# Patient Record
Sex: Male | Born: 1965 | Race: White | Hispanic: No | Marital: Married | Smoking: Never smoker
Health system: Southern US, Community
[De-identification: ages and names within clinical notes are randomized; demographics above are authoritative.]

## PROBLEM LIST (undated history)

## (undated) DIAGNOSIS — E669 Obesity, unspecified: Secondary | ICD-10-CM

## (undated) DIAGNOSIS — I10 Essential (primary) hypertension: Secondary | ICD-10-CM

## (undated) DIAGNOSIS — U071 COVID-19: Secondary | ICD-10-CM

## (undated) DIAGNOSIS — G4730 Sleep apnea, unspecified: Secondary | ICD-10-CM

## (undated) DIAGNOSIS — E119 Type 2 diabetes mellitus without complications: Secondary | ICD-10-CM

## (undated) DIAGNOSIS — E785 Hyperlipidemia, unspecified: Secondary | ICD-10-CM

## (undated) HISTORY — DX: Hyperlipidemia, unspecified: E78.5

## (undated) HISTORY — DX: Essential (primary) hypertension: I10

## (undated) HISTORY — PX: WISDOM TOOTH EXTRACTION: SHX21

## (undated) HISTORY — DX: Obesity, unspecified: E66.9

## (undated) HISTORY — PX: TONSILLECTOMY: SUR1361

## (undated) HISTORY — DX: Type 2 diabetes mellitus without complications: E11.9

## (undated) HISTORY — DX: Sleep apnea, unspecified: G47.30

## (undated) HISTORY — DX: COVID-19: U07.1

---

## 2016-01-24 ENCOUNTER — Ambulatory Visit
Admission: EM | Admit: 2016-01-24 | Discharge: 2016-01-24 | Disposition: A | Discharge status: Home / Self Care | Payer: Managed Care, Other (non HMO) | Attending: Family Medicine | Admitting: Family Medicine

## 2016-01-24 ENCOUNTER — Encounter: Payer: Self-pay | Admitting: *Deleted

## 2016-01-24 DIAGNOSIS — Z20828 Contact with and (suspected) exposure to other viral communicable diseases: Secondary | ICD-10-CM

## 2016-01-24 MED ORDER — OSELTAMIVIR PHOSPHATE 75 MG PO CAPS: ORAL_CAPSULE | ORAL | Status: DC

## 2016-01-24 NOTE — ED Notes (Signed)
Patient's wife was diagnosed with flu at [facility] yesterday. Patient is seeking preventative medication for the flu.

## 2016-01-24 NOTE — Discharge Instructions (Signed)
Droplet Precautions Droplet precautions are guidelines for the care of a person who has a disease that can spread through mucus or secretions from the mouth, lungs, throat, or airways. Following these guidelines helps to prevent the disease from spreading to others. GUIDELINES FOR PATIENTS If you have a disease that can spread through mucus or secretions, follow these guidelines during your stay:  Check with your nurse before you leave the room in which you are being treated.  Wear a mask if you go to another area of the hospital or clinic. Make sure that the mask fits tightly.  Cover your mouth with a tissue when you cough.  Cover your nose with a tissue when you sneeze.  Try to stay 3 feet or more away from another person while talking.  Wash your hands often. GUIDELINES FOR VISITORS If you are visiting a person who has a disease that can spread through mucus or secretions, follow these guidelines: 1. Check with a nurse before you enter a room that has a sign that says "Droplet Precautions." 2. If you are allowed to enter the room, you will be asked to wash your hands and wear a mask over your nose and mouth. Make sure that the mask fits tightly. You may also be told to wear eye protection. 3. Do not take off your mask in the room. If you were told to wear eye protection, do not take it off in the room. 4. Do not eat or drink in the room unless you ask a nurse first. 5. Do not touch anything in the room unless you ask a nurse first. 6. Right after you leave the room, take off your mask and throw it in the trash. If you were told to wear eye protection, take it off and throw it in the trash. Then wash your hands.   This information is not intended to replace advice given to you by your health care provider. Make sure you discuss any questions you have with your health care provider.   Document Released: 02/20/2015 Document Reviewed: 02/20/2015 Elsevier Interactive Patient Education 2016  [employer]  Contact Precautions Contact precautions are guidelines for the care of a person who has a disease that can spread by skin-to-skin contact or by contact with bodily fluids, such as saliva, stool, and fluid from open wounds. Following these guidelines helps to prevent the disease from spreading to others. GUIDELINES FOR PATIENTS If you have a disease that can spread by skin-to-skin contact or by contact with bodily fluids, follow these guidelines during your stay:  Check with your nurse before you leave the room in which you are being treated.  Wear a gown and a bandage (dressing) over the infected area if you need to leave the room in which you are being treated.  Wash your hands often. GUIDELINES FOR VISITORS If you are visiting a person who has a disease that can spread by skin-to-skin contact or by contact with bodily fluids, follow these guidelines: 7. Check with a nurse before you enter a room that has a sign that says "Contact Precautions." 8. If you are allowed to enter the room, you will be asked to wash your hands and wear a gown and gloves. 9. Do not take off your gown or gloves in the room until you are leaving. 10. Do not eat or drink in the room unless you ask a nurse first. 11. Do not touch anything in the room unless you ask a nurse first. 12.  Right before you leave the room, take off your gown and then your gloves. Leave them in the room as directed. Then wash your hands.   This information is not intended to replace advice given to you by your health care provider. Make sure you discuss any questions you have with your health care provider.   Document Released: 02/20/2015 Document Reviewed: 02/20/2015 Elsevier Interactive Patient Education 2016 [employer].  Infection Control in the Home If you have an infection or you are taking care of someone who has an infection, it is important to know how to keep the infection from spreading. Follow these guidelines  to help stop the spread of infection, and talk to your health care provider. HOW ARE INFECTIONS SPREAD? In order for an infection to spread, the following must be present:  A germ. This may be a virus, bacteria, fungus, or parasite.  A place for the germ to live. This may be:  On or in a person, animal, plant, or food.  In soil or water.  On surfaces, such as a door handle.  A susceptible host. This is a person or animal who does not have resistance (immunity) to the germ.  A way for the germ to enter the host. This may occur by:  Direct contact. This may happen by making contact--such as shaking hands or hugging--with an infected person or animal. Some germs can also travel through the air and spread to you if an infected person coughs or sneezes on you or near you.  Indirect contact. This is when the germ enters the host through contact with an infected object. Examples include eating contaminated food, drinking contaminated water, or touching a contaminated surface with your hands and then touching your face, nose, or mouth soon after that. HOW CAN I HELP TO PREVENT INFECTION FROM SPREADING? There are several things that you can do to help prevent infection from spreading. Hand Washing It is very important to wash your hands correctly, following these steps: 13. Wet your hands with clean, running water. 14. Apply soap to your hands. Liquid soap is better than bar soap. 15. Rub your hands together quickly to create lather. 16. Keep rubbing your hands together for at least 20 seconds. Thoroughly scrub all parts of your hands, including under your fingernails and between your fingers. 17. Rinse your hands with clean, running water until all of the soap is gone. 18. Dry your hands with an air dryer or a clean paper or cloth towel, or let your hands air-dry. Do not use your clothing or a soiled towel to dry your hands. 19. If you are in a public restroom, use your towel to turn off the  water faucet and to open the bathroom door. Make sure to wash your hands:  Before:  Visiting a baby or anyone with a weakened or lowered defense (immune) system.  Putting in and taking out any contact lenses.  After:  Working or playing outside.  Touching an animal or its toys or leash.  Handling livestock.  Using the bathroom or helping a child or adult to use the bathroom.  Using household cleaners or toxic chemicals.  Touching or taking out the garbage.  Touching anything dirty around your home.  Handling soiled clothes or rags.  Taking care of a sick child. This includes touching used tissues, toys, and clothes.  Sneezing, coughing, or blowing your nose.  Using public transportation.  Shaking hands.  Using a phone, including your mobile phone.  Touching  money.  Before and after:  Preparing food.  Preparing a bottle for a baby.  Feeding a baby or a young child.  Eating.  Visiting or taking care of someone who is sick.  Changing a diaper.  Changing a bandage (dressing) or taking care of an injury or wound.  Giving or taking medicine. Taking Care of Your Home  Make sure that you have enough cleaning supplies at all times. These include:  Disinfectants.  Reusable cleaning cloths. Wash these after each use.  Paper towels.  Utility gloves. Replace your gloves if they are cracked or torn or if they start to peel.  Use bleach safely. Never mix it with other cleaning products, especially those that contain ammonia. This mixture can create a dangerous gas that may be deadly.  Take care of your cleaning supplies. Toilet brushes, mops, and sponges can breed germs. Soak them in bleach and water for 5 minutes after each use.  Do not pour used mop water down the sink. Pour it down the toilet instead.  Maintain proper ventilation in your home.  If you have a pet, ensure that your pet stays clean. Do not let people with weak immune systems touch bird  droppings, fish tank water, or a litter box.  If you have a cat, be sure to change the litter every day.  In the bathroom, make sure you:  Provide liquid soap.  Change towels and washcloths frequently. Avoid sharing towels and washcloths.  Change toothbrushes often and store them separately in a clean, dry place.  Disinfect the toilet.  Clean the tub, shower, and sink with standard cleaning products.   Mop the floor with a standard cleaner.  Do not share personal items, such as razors, toothbrushes, drinking glasses, deodorant, combs, brushes, towels, and washcloths.   In the kitchen, make sure you:  Store food carefully.  Refrigerate leftovers promptly in covered containers.  Throw out stale or spoiled food.  Clean the inside of your refrigerator each week.  Keep your refrigerator set at 69F (4C) or less, and set your freezer at 8F (-18C) or less.  Thaw foods in the refrigerator or microwave, not at room temperature.  Serve foods at the proper temperature. Do not eat raw meat. Make sure it is cooked to the appropriate temperature.Cook eggs until they are firm.  Wash fruits and vegetables under running water.  Use separate cutting boards, plates, and utensils for raw foods and cooked foods.  Keep work surfaces clean.  Use a clean spoon each time you sample food while cooking.  Wash your dishes in hot, soapy water. Air-dry your dishes or use a dishwasher.  Do not share forks, cups, or spoons during meals.  Wear gloves if laundry is visibly soiled.  Change linens each week or whenever they are soiled.  Do not shake soiled linens. Doing that may send germs into the air. Put dressings, sanitary or incontinence pads, diapers, and gloves in plastic garbage bags for disposal.   This information is not intended to replace advice given to you by your health care provider. Make sure you discuss any questions you have with your health care provider.   Document  Released: 07/15/2008 Document Revised: 10/27/2014 Document Reviewed: 06/08/2014 Elsevier Interactive Patient Education [employer]

## 2016-01-24 NOTE — ED Provider Notes (Signed)
CSN: [id number]     Arrival date & time 01/24/16  0804 History   First MD Initiated Contact with Patient 01/24/16 [phone]     Chief Complaint  Patient presents with  . Influenza   (Consider location/radiation/quality/duration/timing/severity/associated sxs/prior Treatment) HPI   This is a 50 year old male presents requesting a prophylactic Prescription of Tamiflu since his wife was diagnosed yesterday in our clinic with influenza A. He had his flu shot in October or November and currently is denying any symptoms. However he would like to prevent having the flu as much as possible. He denies any fever or chills and has not had any coughing.      History reviewed. No pertinent past medical history. History reviewed. No pertinent past surgical history. Family History  Problem Relation Age of Onset  . Cancer Mother   . Diabetes Mother   . CAD Father   . Emphysema Father    Social History  Substance Use Topics  . Smoking status: Never Smoker   . Smokeless tobacco: Never Used  . Alcohol Use: No    Review of Systems  Constitutional: Negative for fever, chills, diaphoresis, activity change, appetite change and fatigue.  Respiratory: Negative for cough and shortness of breath.   All other systems reviewed and are negative.   Allergies  Review of patient's allergies indicates no known allergies.  Home Medications   Prior to Admission medications   Medication Sig Start Date End Date Taking? Authorizing Provider  oseltamivir (TAMIFLU) 75 MG capsule Take one 75 mg daily for 10 days 01/24/16   Lutricia Feil, PA-C   Meds Ordered and Administered this Visit  Medications - No data to display  BP 153/94 mmHg  Pulse 97  Temp(Src) 98.2 F (36.8 C) (Oral)  Resp 18  Ht  (1.778 m)  Wt 300 lb (136.079 kg)  BMI 43.05 kg/m2  SpO2 99% No data found.   Physical Exam  Constitutional: He is oriented to person, place, and time. He appears well-developed and well-nourished. No  distress.  HENT:  Head: Normocephalic and atraumatic.  Right Ear: External ear normal.  Left Ear: External ear normal.  Nose: Nose normal.  Mouth/Throat: Oropharynx is clear and moist.  Eyes: Conjunctivae are normal. Pupils are equal, round, and reactive to light.  Neck: Normal range of motion. Neck supple.  Pulmonary/Chest: Effort normal and breath sounds normal. No respiratory distress. He has no wheezes. He has no rales.  Musculoskeletal: Normal range of motion. He exhibits no edema or tenderness.  Lymphadenopathy:    He has no cervical adenopathy.  Neurological: He is alert and oriented to person, place, and time.  Skin: Skin is warm and dry. He is not diaphoretic.  Psychiatric: He has a normal mood and affect. His behavior is normal. Judgment and thought content normal.  Nursing note and vitals reviewed.   ED Course  Procedures (including critical care time)  Labs Review Labs Reviewed - No data to display  Imaging Review No results found.   Visual Acuity Review  Right Eye Distance:   Left Eye Distance:   Bilateral Distance:    Right Eye Near:   Left Eye Near:    Bilateral Near:         MDM   1. Exposure to influenza    Discharge Medication List as of 01/24/2016  8:53 AM    START taking these medications   Details  oseltamivir (TAMIFLU) 75 MG capsule Take one 75 mg daily for 10 days, Normal  Plan: 1. Test/x-ray results and diagnosis reviewed with patient 2. rx as per orders; risks, benefits, potential side effects reviewed with patient 3. Recommend supportive treatment with Fluids and rest. If he does develop symptoms of the flu he may double up on the Tamiflu taking it twice a day as it once a day. 4. F/u prn if symptoms worsen or don't improve     Lutricia FeilWilliam P Winton Offord, PA-C 01/24/16 0900

## 2018-04-21 ENCOUNTER — Ambulatory Visit (INDEPENDENT_AMBULATORY_CARE_PROVIDER_SITE_OTHER): Payer: Managed Care, Other (non HMO) | Admitting: Internal Medicine

## 2018-04-21 ENCOUNTER — Encounter: Payer: Self-pay | Admitting: Internal Medicine

## 2018-04-21 ENCOUNTER — Other Ambulatory Visit: Payer: Self-pay

## 2018-04-21 VITALS — BP 156/80 | HR 109 | Temp 98.2°F | Ht 70.0 in | Wt 334.4 lb

## 2018-04-21 DIAGNOSIS — I10 Essential (primary) hypertension: Secondary | ICD-10-CM

## 2018-04-21 DIAGNOSIS — K429 Umbilical hernia without obstruction or gangrene: Secondary | ICD-10-CM | POA: Diagnosis not present

## 2018-04-21 DIAGNOSIS — E559 Vitamin D deficiency, unspecified: Secondary | ICD-10-CM

## 2018-04-21 DIAGNOSIS — Z1159 Encounter for screening for other viral diseases: Secondary | ICD-10-CM

## 2018-04-21 DIAGNOSIS — Z6841 Body Mass Index (BMI) 40.0 and over, adult: Secondary | ICD-10-CM

## 2018-04-21 DIAGNOSIS — Z0184 Encounter for antibody response examination: Secondary | ICD-10-CM

## 2018-04-21 DIAGNOSIS — Z1329 Encounter for screening for other suspected endocrine disorder: Secondary | ICD-10-CM

## 2018-04-21 DIAGNOSIS — Z125 Encounter for screening for malignant neoplasm of prostate: Secondary | ICD-10-CM | POA: Diagnosis not present

## 2018-04-21 DIAGNOSIS — I872 Venous insufficiency (chronic) (peripheral): Secondary | ICD-10-CM | POA: Diagnosis not present

## 2018-04-21 DIAGNOSIS — Z1211 Encounter for screening for malignant neoplasm of colon: Secondary | ICD-10-CM | POA: Diagnosis not present

## 2018-04-21 DIAGNOSIS — Z23 Encounter for immunization: Secondary | ICD-10-CM | POA: Diagnosis not present

## 2018-04-21 DIAGNOSIS — E66813 Obesity, class 3: Secondary | ICD-10-CM

## 2018-04-21 DIAGNOSIS — Z1389 Encounter for screening for other disorder: Secondary | ICD-10-CM

## 2018-04-21 MED ORDER — HYDROCHLOROTHIAZIDE 12.5 MG PO TABS: 12.5000 mg | ORAL_TABLET | Freq: Every day | ORAL | 0 refills | Status: DC

## 2018-04-21 MED ORDER — TRIAMCINOLONE ACETONIDE 0.1 % EX CREA: 1.0000 "application " | TOPICAL_CREAM | Freq: Two times a day (BID) | CUTANEOUS | 0 refills | Status: DC

## 2018-04-21 NOTE — Patient Instructions (Addendum)
Fasting labs Friday or next week  Fu in 2-3 weeks  Referred colonoscopy with Dr. Servando Snare in [facility]     Colonoscopy, Adult A colonoscopy is an exam to look at the entire large intestine. During the exam, a lubricated, bendable tube is inserted into the anus and then passed into the rectum, colon, and other parts of the large intestine. A colonoscopy is often done as a part of normal colorectal screening or in response to certain symptoms, such as anemia, persistent diarrhea, abdominal pain, and blood in the stool. The exam can help screen for and diagnose medical problems, including:  Tumors.  Polyps.  Inflammation.  Areas of bleeding.  Tell a health care provider about:  Any allergies you have.  All medicines you are taking, including vitamins, herbs, eye drops, creams, and over-the-counter medicines.  Any problems you or family members have had with anesthetic medicines.  Any blood disorders you have.  Any surgeries you have had.  Any medical conditions you have.  Any problems you have had passing stool. What are the risks? Generally, this is a safe procedure. However, problems may occur, including:  Bleeding.  A tear in the intestine.  A reaction to medicines given during the exam.  Infection (rare).  What happens before the procedure? Eating and drinking restrictions Follow instructions from your health care provider about eating and drinking, which may include:  A few days before the procedure - follow a low-fiber diet. Avoid nuts, seeds, dried fruit, raw fruits, and vegetables.  1-3 days before the procedure - follow a clear liquid diet. Drink only clear liquids, such as clear broth or bouillon, black coffee or tea, clear juice, clear soft drinks or sports drinks, gelatin dessert, and popsicles. Avoid any liquids that contain red or purple dye.  On the day of the procedure - do not eat or drink anything during the 2 hours before the procedure, or within the time  period that your health care provider recommends.  Bowel prep If you were prescribed an oral bowel prep to clean out your colon:  Take it as told by your health care provider. Starting the day before your procedure, you will need to drink a large amount of medicated liquid. The liquid will cause you to have multiple loose stools until your stool is almost clear or light green.  If your skin or anus gets irritated from diarrhea, you may use these to relieve the irritation: ? Medicated wipes, such as adult wet wipes with aloe and vitamin E. ? A skin soothing-product like petroleum jelly.  If you vomit while drinking the bowel prep, take a break for up to 60 minutes and then begin the bowel prep again. If vomiting continues and you cannot take the bowel prep without vomiting, call your health care provider.  General instructions  Ask your health care provider about changing or stopping your regular medicines. This is especially important if you are taking diabetes medicines or blood thinners.  Plan to have someone take you home from the hospital or clinic. What happens during the procedure?  An IV tube may be inserted into one of your veins.  You will be given medicine to help you relax (sedative).  To reduce your risk of infection: ? Your health care team will wash or sanitize their hands. ? Your anal area will be washed with soap.  You will be asked to lie on your side with your knees bent.  Your health care provider will lubricate a long,  thin, flexible tube. The tube will have a camera and a light on the end.  The tube will be inserted into your anus.  The tube will be gently eased through your rectum and colon.  Air will be delivered into your colon to keep it open. You may feel some pressure or cramping.  The camera will be used to take images during the procedure.  A small tissue sample may be removed from your body to be examined under a microscope (biopsy). If any  potential problems are found, the tissue will be sent to a lab for testing.  If small polyps are found, your health care provider may remove them and have them checked for cancer cells.  The tube that was inserted into your anus will be slowly removed. The procedure may vary among health care providers and hospitals. What happens after the procedure?  Your blood pressure, heart rate, breathing rate, and blood oxygen level will be monitored until the medicines you were given have worn off.  Do not drive for 24 hours after the exam.  You may have a small amount of blood in your stool.  You may pass gas and have mild abdominal cramping or bloating due to the air that was used to inflate your colon during the exam.  It is up to you to get the results of your procedure. Ask your health care provider, or the department performing the procedure, when your results will be ready. This information is not intended to replace advice given to you by your health care provider. Make sure you discuss any questions you have with your health care provider. Document Released: 10/03/2000 Document Revised: 08/06/2016 Document Reviewed: 12/18/2015 Elsevier Interactive Patient Education  2018 [employer] Inc[employer]  DASH Eating Plan DASH stands for "Dietary Approaches to Stop Hypertension." The DASH eating plan is a healthy eating plan that has been shown to reduce high blood pressure (hypertension). It may also reduce your risk for type 2 diabetes, heart disease, and stroke. The DASH eating plan may also help with weight loss. What are tips for following this plan? General guidelines  Avoid eating more than 2,300 mg (milligrams) of salt (sodium) a day. If you have hypertension, you may need to reduce your sodium intake to 1,500 mg a day.  Limit alcohol intake to no more than 1 drink a day for nonpregnant women and 2 drinks a day for men. One drink equals 12 oz of beer, 5 oz of wine, or 1 oz of hard liquor.  Work  with your health care provider to maintain a healthy body weight or to lose weight. Ask what an ideal weight is for you.  Get at least 30 minutes of exercise that causes your heart to beat faster (aerobic exercise) most days of the week. Activities may include walking, swimming, or biking.  Work with your health care provider or diet and nutrition specialist (dietitian) to adjust your eating plan to your individual calorie needs. Reading food labels  Check food labels for the amount of sodium per serving. Choose foods with less than 5 percent of the Daily Value of sodium. Generally, foods with less than 300 mg of sodium per serving fit into this eating plan.  To find whole grains, look for the word "whole" as the first word in the ingredient list. Shopping  Buy products labeled as "low-sodium" or "no salt added."  Buy fresh foods. Avoid canned foods and premade or frozen meals. Cooking  Avoid adding salt when cooking.  Use salt-free seasonings or herbs instead of table salt or sea salt. Check with your health care provider or pharmacist before using salt substitutes.  Do not fry foods. Cook foods using healthy methods such as baking, boiling, grilling, and broiling instead.  Cook with heart-healthy oils, such as olive, canola, soybean, or sunflower oil. Meal planning   Eat a balanced diet that includes: ? 5 or more servings of fruits and vegetables each day. At each meal, try to fill half of your plate with fruits and vegetables. ? Up to 6-8 servings of whole grains each day. ? Less than 6 oz of lean meat, poultry, or fish each day. A 3-oz serving of meat is about the same size as a deck of cards. One egg equals 1 oz. ? 2 servings of low-fat dairy each day. ? A serving of nuts, seeds, or beans 5 times each week. ? Heart-healthy fats. Healthy fats called Omega-3 fatty acids are found in foods such as flaxseeds and coldwater fish, like sardines, salmon, and mackerel.  Limit how much you  eat of the following: ? Canned or prepackaged foods. ? Food that is high in trans fat, such as fried foods. ? Food that is high in saturated fat, such as fatty meat. ? Sweets, desserts, sugary drinks, and other foods with added sugar. ? Full-fat dairy products.  Do not salt foods before eating.  Try to eat at least 2 vegetarian meals each week.  Eat more home-cooked food and less restaurant, buffet, and fast food.  When eating at a restaurant, ask that your food be prepared with less salt or no salt, if possible. What foods are recommended? The items listed may not be a complete list. Talk with your dietitian about what dietary choices are best for you. Grains Whole-grain or whole-wheat bread. Whole-grain or whole-wheat pasta. Brown rice. Orpah Cobb. Bulgur. Whole-grain and low-sodium cereals. Pita bread. Low-fat, low-sodium crackers. Whole-wheat flour tortillas. Vegetables Fresh or frozen vegetables (raw, steamed, roasted, or grilled). Low-sodium or reduced-sodium tomato and vegetable juice. Low-sodium or reduced-sodium tomato sauce and tomato paste. Low-sodium or reduced-sodium canned vegetables. Fruits All fresh, dried, or frozen fruit. Canned fruit in natural juice (without added sugar). Meat and other protein foods Skinless chicken or [place]. Ground chicken or [place]. Pork with fat trimmed off. Fish and seafood. Egg whites. Dried beans, peas, or lentils. Unsalted nuts, nut butters, and seeds. Unsalted canned beans. Lean cuts of beef with fat trimmed off. Low-sodium, lean deli meat. Dairy Low-fat (1%) or fat-free (skim) milk. Fat-free, low-fat, or reduced-fat cheeses. Nonfat, low-sodium ricotta or cottage cheese. Low-fat or nonfat yogurt. Low-fat, low-sodium cheese. Fats and oils Soft margarine without trans fats. Vegetable oil. Low-fat, reduced-fat, or light mayonnaise and salad dressings (reduced-sodium). Canola, safflower, olive, soybean, and sunflower oils. Avocado. Seasoning  and other foods Herbs. Spices. Seasoning mixes without salt. Unsalted popcorn and pretzels. Fat-free sweets. What foods are not recommended? The items listed may not be a complete list. Talk with your dietitian about what dietary choices are best for you. Grains Baked goods made with fat, such as croissants, muffins, or some breads. Dry pasta or rice meal packs. Vegetables Creamed or fried vegetables. Vegetables in a cheese sauce. Regular canned vegetables (not low-sodium or reduced-sodium). Regular canned tomato sauce and paste (not low-sodium or reduced-sodium). Regular tomato and vegetable juice (not low-sodium or reduced-sodium). Rosita Fire. Olives. Fruits Canned fruit in a light or heavy syrup. Fried fruit. Fruit in cream or butter sauce. Meat and other protein foods  Fatty cuts of meat. Ribs. Fried meat. Tomasa Blase. Sausage. Bologna and other processed lunch meats. Salami. Fatback. Hotdogs. Bratwurst. Salted nuts and seeds. Canned beans with added salt. Canned or smoked fish. Whole eggs or egg yolks. Chicken or [place] with skin. Dairy Whole or 2% milk, cream, and half-and-half. Whole or full-fat cream cheese. Whole-fat or sweetened yogurt. Full-fat cheese. Nondairy creamers. Whipped toppings. Processed cheese and cheese spreads. Fats and oils Butter. Stick margarine. Lard. Shortening. Ghee. Bacon fat. Tropical oils, such as coconut, palm kernel, or palm oil. Seasoning and other foods Salted popcorn and pretzels. Onion salt, garlic salt, seasoned salt, table salt, and sea salt. Worcestershire sauce. Tartar sauce. Barbecue sauce. Teriyaki sauce. Soy sauce, including reduced-sodium. Steak sauce. Canned and packaged gravies. Fish sauce. Oyster sauce. Cocktail sauce. Horseradish that you find on the shelf. Ketchup. Mustard. Meat flavorings and tenderizers. Bouillon cubes. Hot sauce and Tabasco sauce. Premade or packaged marinades. Premade or packaged taco seasonings. Relishes. Regular salad  dressings. Where to find more information:  National Heart, Lung, and Blood Institute: PopSteam.is  American Heart Association: www.heart.org Summary  The DASH eating plan is a healthy eating plan that has been shown to reduce high blood pressure (hypertension). It may also reduce your risk for type 2 diabetes, heart disease, and stroke.  With the DASH eating plan, you should limit salt (sodium) intake to 2,300 mg a day. If you have hypertension, you may need to reduce your sodium intake to 1,500 mg a day.  When on the DASH eating plan, aim to eat more fresh fruits and vegetables, whole grains, lean proteins, low-fat dairy, and heart-healthy fats.  Work with your health care provider or diet and nutrition specialist (dietitian) to adjust your eating plan to your individual calorie needs. This information is not intended to replace advice given to you by your health care provider. Make sure you discuss any questions you have with your health care provider. Document Released: 09/25/2011 Document Revised: 09/29/2016 Document Reviewed: 09/29/2016 Elsevier Interactive Patient Education  2018 [employer].  Hypertension Hypertension, commonly called high blood pressure, is when the force of blood pumping through the arteries is too strong. The arteries are the blood vessels that carry blood from the heart throughout the body. Hypertension forces the heart to work harder to pump blood and may cause arteries to become narrow or stiff. Having untreated or uncontrolled hypertension can cause heart attacks, strokes, kidney disease, and other problems. A blood pressure reading consists of a higher number over a lower number. Ideally, your blood pressure should be below 120/80. The first ("top") number is called the systolic pressure. It is a measure of the pressure in your arteries as your heart beats. The second ("bottom") number is called the diastolic pressure. It is a measure of the pressure in  your arteries as the heart relaxes. What are the causes? The cause of this condition is not known. What increases the risk? Some risk factors for high blood pressure are under your control. Others are not. Factors you can change  Smoking.  Having type 2 diabetes mellitus, high cholesterol, or both.  Not getting enough exercise or physical activity.  Being overweight.  Having too much fat, sugar, calories, or salt (sodium) in your diet.  Drinking too much alcohol. Factors that are difficult or impossible to change  Having chronic kidney disease.  Having a family history of high blood pressure.  Age. Risk increases with age.  Race. You may be at higher risk if you are  African-American.  Gender. Men are at higher risk than women before age 2. After age 39, women are at higher risk than men.  Having obstructive sleep apnea.  Stress. What are the signs or symptoms? Extremely high blood pressure (hypertensive crisis) may cause:  Headache.  Anxiety.  Shortness of breath.  Nosebleed.  Nausea and vomiting.  Severe chest pain.  Jerky movements you cannot control (seizures).  How is this diagnosed? This condition is diagnosed by measuring your blood pressure while you are seated, with your arm resting on a surface. The cuff of the blood pressure monitor will be placed directly against the skin of your upper arm at the level of your heart. It should be measured at least twice using the same arm. Certain conditions can cause a difference in blood pressure between your right and left arms. Certain factors can cause blood pressure readings to be lower or higher than normal (elevated) for a short period of time:  When your blood pressure is higher when you are in a health care provider's office than when you are at home, this is called white coat hypertension. Most people with this condition do not need medicines.  When your blood pressure is higher at home than when you are  in a health care provider's office, this is called masked hypertension. Most people with this condition may need medicines to control blood pressure.  If you have a high blood pressure reading during one visit or you have normal blood pressure with other risk factors:  You may be asked to return on a different day to have your blood pressure checked again.  You may be asked to monitor your blood pressure at home for 1 week or longer.  If you are diagnosed with hypertension, you may have other blood or imaging tests to help your health care provider understand your overall risk for other conditions. How is this treated? This condition is treated by making healthy lifestyle changes, such as eating healthy foods, exercising more, and reducing your alcohol intake. Your health care provider may prescribe medicine if lifestyle changes are not enough to get your blood pressure under control, and if:  Your systolic blood pressure is above 130.  Your diastolic blood pressure is above 80.  Your personal target blood pressure may vary depending on your medical conditions, your age, and other factors. Follow these instructions at home: Eating and drinking  Eat a diet that is high in fiber and potassium, and low in sodium, added sugar, and fat. An example eating plan is called the DASH (Dietary Approaches to Stop Hypertension) diet. To eat this way: ? Eat plenty of fresh fruits and vegetables. Try to fill half of your plate at each meal with fruits and vegetables. ? Eat whole grains, such as whole wheat pasta, brown rice, or whole grain bread. Fill about one quarter of your plate with whole grains. ? Eat or drink low-fat dairy products, such as skim milk or low-fat yogurt. ? Avoid fatty cuts of meat, processed or cured meats, and poultry with skin. Fill about one quarter of your plate with lean proteins, such as fish, chicken without skin, beans, eggs, and tofu. ? Avoid premade and processed foods. These  tend to be higher in sodium, added sugar, and fat.  Reduce your daily sodium intake. Most people with hypertension should eat less than 1,500 mg of sodium a day.  Limit alcohol intake to no more than 1 drink a day for nonpregnant women and 2  drinks a day for men. One drink equals 12 oz of beer, 5 oz of wine, or 1 oz of hard liquor. Lifestyle  Work with your health care provider to maintain a healthy body weight or to lose weight. Ask what an ideal weight is for you.  Get at least 30 minutes of exercise that causes your heart to beat faster (aerobic exercise) most days of the week. Activities may include walking, swimming, or biking.  Include exercise to strengthen your muscles (resistance exercise), such as pilates or lifting weights, as part of your weekly exercise routine. Try to do these types of exercises for 30 minutes at least 3 days a week.  Do not use any products that contain nicotine or tobacco, such as cigarettes and e-cigarettes. If you need help quitting, ask your health care provider.  Monitor your blood pressure at home as told by your health care provider.  Keep all follow-up visits as told by your health care provider. This is important. Medicines  Take over-the-counter and prescription medicines only as told by your health care provider. Follow directions carefully. Blood pressure medicines must be taken as prescribed.  Do not skip doses of blood pressure medicine. Doing this puts you at risk for problems and can make the medicine less effective.  Ask your health care provider about side effects or reactions to medicines that you should watch for. Contact a health care provider if:  You think you are having a reaction to a medicine you are taking.  You have headaches that keep coming back (recurring).  You feel dizzy.  You have swelling in your ankles.  You have trouble with your vision. Get help right away if:  You develop a severe headache or  confusion.  You have unusual weakness or numbness.  You feel faint.  You have severe pain in your chest or abdomen.  You vomit repeatedly.  You have trouble breathing. Summary  Hypertension is when the force of blood pumping through your arteries is too strong. If this condition is not controlled, it may put you at risk for serious complications.  Your personal target blood pressure may vary depending on your medical conditions, your age, and other factors. For most people, a normal blood pressure is less than 120/80.  Hypertension is treated with lifestyle changes, medicines, or a combination of both. Lifestyle changes include weight loss, eating a healthy, low-sodium diet, exercising more, and limiting alcohol. This information is not intended to replace advice given to you by your health care provider. Make sure you discuss any questions you have with your health care provider. Document Released: 10/06/2005 Document Revised: 09/03/2016 Document Reviewed: 09/03/2016 Elsevier Interactive Patient Education  2018 [employer]  Stasis Dermatitis Stasis dermatitis is a long-term (chronic) skin condition that happens when veins can no longer pump blood back to the heart (poor circulation). This condition causes a red or brown scaly rash or sores (ulcers) from the pooling of blood (stasis). This condition usually affects the lower legs. It may affect one leg or both legs. Without treatment, severe stasis dermatitis can lead to other skin conditions and infections. What are the causes? This condition is caused by poor circulation. What increases the risk? This condition is more likely to develop in people who:  Are not very active.  Stand for long periods of time.  Have veins that have become enlarged and twisted (varicose veins).  Have leg veins that are not strong enough to send blood back to the heart (venous  insufficiency).  Have had a blood clot.  Have been pregnant many  times.  Have had vein surgery.  Are obese.  Have heart or kidney failure.  Are 34 years of age or older.  What are the signs or symptoms? Common early symptoms of this condition include:  Swelling in your ankle or leg. This might get better overnight but be worse again in the day.  Skin that looks thin on your ankle and leg.  Manson Passey marks that develop slowly.  Skin that is easily irritated or cracked.  Red, swollen skin.  An achy or heavy feeling after you walk or stand for long periods of time.  Pain.  Later and more severe symptoms of this condition include:  Skin that looks shiny.  Small, open sores (ulcers). These are often red or purple.  Dry, cracking skin.  Skin that feels hard.  Severe itching.  A change in the shape or color of your lower legs.  Severe pain.  Difficulty walking.  How is this diagnosed? Your health care provider may suspect this condition from your symptoms and medical history. Your health care provider will also do a physical exam. You may need to see a health care provider who specializes in skin diseases (dermatologist). You may also have tests to confirm the diagnosis, including:  Blood tests.  Imaging studies to check blood flow (Doppler ultrasound).  Allergy tests.  How is this treated? Treatment for this condition may include medicine, such as:  Corticosteroid creams and ointments.  Non-corticosteroid medicines applied to the skin (topical).  Medicine to reduce swelling in the legs (diuretics).  Antibiotics.  Medicine to relieve itching (antihistamines).  You may also have to wear:  Compression stockings or an elastic wrap to improve circulation.  A bandage (dressing).  A wrap that contains zinc and gelatin (Unna boot).  Follow these instructions at home: Skin Care  Moisturize your skin as told by your health care provider. Do not use moisturizers with fragrance. This can irritate your skin.  Apply cool  compresses to the affected areas.  Do not scratch your skin.  Do not rub your skin dry after a bath or shower. Gently pat your skin dry.  Do not use scented soaps, detergents, or perfumes. Medicines  Take or use over-the-counter and prescription medicines only as told by your health care provider.  If you were prescribed an antibiotic medicine, take or use it as told by your health care provider. Do not stop taking or using the antibiotic even if your condition starts to improve. Lifestyle  Do not stand or sit in one position for long periods of time.  Do not cross your legs when you sit.  Raise (elevate) your legs above the level of your heart when you are sitting or lying down.  Walk as told by your health care provider. Walking increases blood flow.  Wear comfortable, loose-fitting clothing. Circulation in your legs will be worse if you wear tight pants, belts, and waistbands. General instructions  Change and remove any dressing as told by your health care provider, if this applies.  Wear compression stockings as told by your health care provider, if this applies. These stockings help to prevent blood clots and reduce swelling in your legs.  Wear the [employer] as told by your health care provider, if this applies.  Keep all follow-up visits as told by your health care provider. This is important. Contact a health care provider if:  Your condition does not improve with  treatment.  Your condition gets worse.  You have signs of infection in the affected area. Watch for: ? Swelling. ? Tenderness. ? Redness. ? Soreness. ? Warmth.  You have a fever. Get help right away if:  You notice red streaks coming from the affected area.  Your bone or joint underneath the affected area becomes painful after the skin has healed.  The affected area turns darker.  You feel a deep pain in your leg or groin.  You are short of breath. This information is not intended to replace  advice given to you by your health care provider. Make sure you discuss any questions you have with your health care provider. Document Released: 01/15/2006 Document Revised: 06/03/2016 Document Reviewed: 02/21/2015 Elsevier Interactive Patient Education  2018 [employer].  Exercising to [employer] Exercising can help you to lose weight. In order to lose weight through exercise, you need to do vigorous-intensity exercise. You can tell that you are exercising with vigorous intensity if you are breathing very hard and fast and cannot hold a conversation while exercising. Moderate-intensity exercise helps to maintain your current weight. You can tell that you are exercising at a moderate level if you have a higher heart rate and faster breathing, but you are still able to hold a conversation. How often should I exercise? Choose an activity that you enjoy and set realistic goals. Your health care provider can help you to make an activity plan that works for you. Exercise regularly as directed by your health care provider. This may include:  Doing resistance training twice each week, such as: ? Push-ups. ? Sit-ups. ? Lifting weights. ? Using resistance bands.  Doing a given intensity of exercise for a given amount of time. Choose from these options: ? 150 minutes of moderate-intensity exercise every week. ? 75 minutes of vigorous-intensity exercise every week. ? A mix of moderate-intensity and vigorous-intensity exercise every week.  Children, pregnant women, people who are out of shape, people who are overweight, and older adults may need to consult a health care provider for individual recommendations. If you have any sort of medical condition, be sure to consult your health care provider before starting a new exercise program. What are some activities that can help me to lose weight?  Walking at a rate of at least 4.5 miles an hour.  Jogging or running at a rate of 5 miles per  hour.  Biking at a rate of at least 10 miles per hour.  Lap swimming.  Roller-skating or in-line skating.  Cross-country skiing.  Vigorous competitive sports, such as football, basketball, and soccer.  Jumping rope.  Aerobic dancing. How can I be more active in my day-to-day activities?  Use the stairs instead of the elevator.  Take a walk during your lunch break.  If you drive, park your car farther away from work or school.  If you take public transportation, get off one stop early and walk the rest of the way.  Make all of your phone calls while standing up and walking around.  Get up, stretch, and walk around every 30 minutes throughout the day. What guidelines should I follow while exercising?  Do not exercise so much that you hurt yourself, feel dizzy, or get very short of breath.  Consult your health care provider prior to starting a new exercise program.  Wear comfortable clothes and shoes with good support.  Drink plenty of water while you exercise to prevent dehydration or heat stroke. Body water is  lost during exercise and must be replaced.  Work out until you breathe faster and your heart beats faster. This information is not intended to replace advice given to you by your health care provider. Make sure you discuss any questions you have with your health care provider. Document Released: 11/08/2010 Document Revised: 03/13/2016 Document Reviewed: 03/09/2014 Elsevier Interactive Patient Education  [employer].

## 2018-04-21 NOTE — Progress Notes (Signed)
Chief Complaint  Patient presents with  . New Patient (Initial Visit)   New patient  1. HTN pt has not seen physician in years and also never been on meds BP in 2017 was >150 as well he does not exercise. He does not have sx's  2. C/o left lower leg discolored wife noticed and wanted him to mention no trauma   Review of Systems  Constitutional: Negative for weight loss.  HENT: Negative for hearing loss.   Eyes: Negative for blurred vision.  Respiratory: Negative for shortness of breath.   Cardiovascular: Negative for chest pain.  Musculoskeletal: Negative for joint pain.  Skin: Positive for rash.  Neurological: Negative for headaches.  Psychiatric/Behavioral: Negative for depression.   Past Medical History:  Diagnosis Date  . Hypertension   . Obesity    Past Surgical History:  Procedure Laterality Date  . TONSILLECTOMY     age 86 or 68   . WISDOM TOOTH EXTRACTION     Family History  Problem Relation Age of Onset  . Cancer Mother        breast  . CAD Father   . Emphysema Father   . Heart disease Father        CABG   Social History   Socioeconomic History  . Marital status: Married    Spouse name: Not on file  . Number of children: Not on file  . Years of education: Not on file  . Highest education level: Not on file  Occupational History  . Not on file  Social Needs  . Financial resource strain: Not on file  . Food insecurity:    Worry: Not on file    Inability: Not on file  . Transportation needs:    Medical: Not on file    Non-medical: Not on file  Tobacco Use  . Smoking status: Never Smoker  . Smokeless tobacco: Former Network engineer and Sexual Activity  . Alcohol use: Yes  . Drug use: No  . Sexual activity: Yes  Lifestyle  . Physical activity:    Days per week: Not on file    Minutes per session: Not on file  . Stress: Not on file  Relationships  . Social connections:    Talks on phone: Not on file    Gets together: Not on file    Attends  religious service: Not on file    Active member of club or organization: Not on file    Attends meetings of clubs or organizations: Not on file    Relationship status: Not on file  . Intimate partner violence:    Fear of current or ex partner: Not on file    Emotionally abused: Not on file    Physically abused: Not on file    Forced sexual activity: Not on file  Other Topics Concern  . 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    Former chewing tobacco   Owns guns, wears seat belts, safe in relationship    No outpatient medications have been marked as taking for the 04/21/18 encounter (Office Visit) with McLean-Scocuzza, Nino Glow, MD.   No Known Allergies No results found for this or any previous visit (from the past 2160 hour(s)). Objective  Body mass index is 47.98 kg/m. Wt Readings from Last 3 Encounters:  04/21/18 Marland Kitchen)  334 lb 6.4 oz (151.7 kg)  01/24/16 300 lb (136.1 kg)   Temp Readings from Last 3 Encounters:  04/21/18 98.2 F (36.8 C) (Oral)  01/24/16 98.2 F (36.8 C) (Oral)   BP Readings from Last 3 Encounters:  04/21/18 (!) 156/80  01/24/16 (!) 153/94   Pulse Readings from Last 3 Encounters:  04/21/18 (!) 109  01/24/16 97    Physical Exam  Constitutional: He is oriented to person, place, and time. He appears well-developed and well-nourished. He is cooperative.  HENT:  Head: Normocephalic and atraumatic.  Mouth/Throat: Oropharynx is clear and moist and mucous membranes are normal.  Eyes: Pupils are equal, round, and reactive to light. Conjunctivae are normal.  Cardiovascular: Regular rhythm and normal heart sounds. Tachycardia present.  Pulmonary/Chest: Effort normal and breath sounds normal.  Abdominal: A hernia is present.    Neurological: He is alert and oriented to person, place, and time. Gait normal.  Skin: Skin is warm and dry.  Stasis dermatitis changes  LLE  Psychiatric: He has a normal mood and affect. His speech is normal and behavior is normal. Judgment and thought content normal. Cognition and memory are normal.  Nursing note and vitals reviewed.   Assessment   1. HTN  2. Stasis dermatitis LLE 3. Umbilical hernia  4. HM Plan   1. Start hctz 12.5  Check labs  F/u in 2-3 weeks  2. Trial tmc bid prn  3. Ed hernia  4.  Had flu shot  Tdap given today Disc shingrix in future  Check hep B, MMR status  Check fasting labs upcoming declines std check   Referred to [facility] Dr. Allen Norris screening colonoscopy  No need for dermatology referral now.  Used to chew chewing tobacco   [facility] saw 2018 wears glasses   Provider: Dr. Olivia Mackie McLean-Scocuzza-Internal Medicine

## 2018-04-23 ENCOUNTER — Ambulatory Visit: Payer: Managed Care, Other (non HMO) | Admitting: Internal Medicine

## 2018-04-26 ENCOUNTER — Other Ambulatory Visit (INDEPENDENT_AMBULATORY_CARE_PROVIDER_SITE_OTHER): Payer: Managed Care, Other (non HMO)

## 2018-04-26 DIAGNOSIS — Z1159 Encounter for screening for other viral diseases: Secondary | ICD-10-CM

## 2018-04-26 DIAGNOSIS — Z1329 Encounter for screening for other suspected endocrine disorder: Secondary | ICD-10-CM

## 2018-04-26 DIAGNOSIS — Z125 Encounter for screening for malignant neoplasm of prostate: Secondary | ICD-10-CM

## 2018-04-26 DIAGNOSIS — E559 Vitamin D deficiency, unspecified: Secondary | ICD-10-CM | POA: Diagnosis not present

## 2018-04-26 DIAGNOSIS — Z0184 Encounter for antibody response examination: Secondary | ICD-10-CM

## 2018-04-26 DIAGNOSIS — I10 Essential (primary) hypertension: Secondary | ICD-10-CM

## 2018-04-26 DIAGNOSIS — Z1389 Encounter for screening for other disorder: Secondary | ICD-10-CM

## 2018-04-26 LAB — TSH: TSH: 4.46 u[IU]/mL (ref 0.35–4.50)

## 2018-04-26 LAB — COMPREHENSIVE METABOLIC PANEL
ALBUMIN: 4.2 g/dL (ref 3.5–5.2)
ALK PHOS: 107 U/L (ref 39–117)
ALT: 25 U/L (ref 0–53)
AST: 18 U/L (ref 0–37)
BUN: 16 mg/dL (ref 6–23)
CHLORIDE: 99 meq/L (ref 96–112)
CO2: 30 mEq/L (ref 19–32)
Calcium: 9.4 mg/dL (ref 8.4–10.5)
Creatinine, Ser: 1.15 mg/dL (ref 0.40–1.50)
GFR: 71.01 mL/min (ref >60.00)
Glucose, Bld: 142 mg/dL — ABNORMAL HIGH (ref 70–99)
POTASSIUM: 4.6 meq/L (ref 3.5–5.1)
Sodium: 139 mEq/L (ref 135–145)
TOTAL PROTEIN: 6.9 g/dL (ref 6.0–8.3)
Total Bilirubin: 0.8 mg/dL (ref 0.2–1.2)

## 2018-04-26 LAB — CBC WITH DIFFERENTIAL/PLATELET
BASOS PCT: 0.4 % (ref 0.0–3.0)
Basophils Absolute: 0 10*3/uL (ref 0.0–0.1)
Eosinophils Absolute: 0.2 10*3/uL (ref 0.0–0.7)
Eosinophils Relative: 2.6 % (ref 0.0–5.0)
HCT: 47.8 % (ref 39.0–52.0)
HEMOGLOBIN: 16.5 g/dL (ref 13.0–17.0)
LYMPHS ABS: 2.3 10*3/uL (ref 0.7–4.0)
Lymphocytes Relative: 34.3 % (ref 12.0–46.0)
MCHC: 34.4 g/dL (ref 30.0–36.0)
MCV: 87.2 fl (ref 78.0–100.0)
MONOS PCT: 10.3 % (ref 3.0–12.0)
Monocytes Absolute: 0.7 10*3/uL (ref 0.1–1.0)
Neutro Abs: 3.6 10*3/uL (ref 1.4–7.7)
Neutrophils Relative %: 52.4 % (ref 43.0–77.0)
Platelets: 267 10*3/uL (ref 150.0–400.0)
RBC: 5.48 Mil/uL (ref 4.22–5.81)
RDW: 13.5 % (ref 11.5–15.5)
WBC: 6.8 10*3/uL (ref 4.0–10.5)

## 2018-04-26 LAB — LIPID PANEL
CHOLESTEROL: 152 mg/dL (ref 0–200)
HDL: 40.4 mg/dL (ref >39.00)
LDL Cholesterol: 81 mg/dL (ref 0–99)
NONHDL: 111.32
Total CHOL/HDL Ratio: 4
Triglycerides: 153 mg/dL — ABNORMAL HIGH (ref 0.0–149.0)
VLDL: 30.6 mg/dL (ref 0.0–40.0)

## 2018-04-26 LAB — T4, FREE: FREE T4: 0.85 ng/dL (ref 0.60–1.60)

## 2018-04-26 LAB — VITAMIN D 25 HYDROXY (VIT D DEFICIENCY, FRACTURES): VITD: 24.5 ng/mL — ABNORMAL LOW (ref 30.00–100.00)

## 2018-04-26 LAB — PSA: PSA: 0.71 ng/mL (ref 0.10–4.00)

## 2018-04-27 ENCOUNTER — Other Ambulatory Visit: Payer: Self-pay | Admitting: Internal Medicine

## 2018-04-27 ENCOUNTER — Other Ambulatory Visit: Payer: Self-pay

## 2018-04-27 ENCOUNTER — Other Ambulatory Visit (INDEPENDENT_AMBULATORY_CARE_PROVIDER_SITE_OTHER): Payer: Managed Care, Other (non HMO)

## 2018-04-27 DIAGNOSIS — R739 Hyperglycemia, unspecified: Secondary | ICD-10-CM

## 2018-04-27 LAB — URINALYSIS, ROUTINE W REFLEX MICROSCOPIC
Bilirubin, UA: NEGATIVE
Glucose, UA: NEGATIVE
Ketones, UA: NEGATIVE
LEUKOCYTES UA: NEGATIVE
Nitrite, UA: NEGATIVE
PH UA: 5 (ref 5.0–7.5)
PROTEIN UA: NEGATIVE
Specific Gravity, UA: 1.02 (ref 1.005–1.030)
UUROB: 0.2 mg/dL (ref 0.2–1.0)

## 2018-04-27 LAB — HEMOGLOBIN A1C: Hgb A1c MFr Bld: 7.4 % — ABNORMAL HIGH (ref 4.6–6.5)

## 2018-04-27 LAB — MICROSCOPIC EXAMINATION
Bacteria, UA: NONE SEEN
CASTS: NONE SEEN /LPF
EPITHELIAL CELLS (NON RENAL): NONE SEEN /HPF (ref 0–10)

## 2018-04-27 LAB — MEASLES/MUMPS/RUBELLA IMMUNITY
Mumps IgG: 203 AU/mL
RUBEOLA IGG: 218 [AU]/ml
Rubella: 2.54 index

## 2018-04-27 LAB — HEPATITIS B SURFACE ANTIBODY, QUANTITATIVE: HEPATITIS B-POST: 503 m[IU]/mL (ref >=10)

## 2018-05-14 ENCOUNTER — Encounter: Payer: Self-pay | Admitting: Internal Medicine

## 2018-05-14 ENCOUNTER — Other Ambulatory Visit: Payer: Self-pay | Admitting: Internal Medicine

## 2018-05-14 ENCOUNTER — Ambulatory Visit (INDEPENDENT_AMBULATORY_CARE_PROVIDER_SITE_OTHER): Payer: Managed Care, Other (non HMO) | Admitting: Internal Medicine

## 2018-05-14 VITALS — BP 138/96 | HR 88 | Temp 97.5°F | Ht 70.0 in | Wt 331.2 lb

## 2018-05-14 DIAGNOSIS — I10 Essential (primary) hypertension: Secondary | ICD-10-CM

## 2018-05-14 DIAGNOSIS — E119 Type 2 diabetes mellitus without complications: Secondary | ICD-10-CM | POA: Insufficient documentation

## 2018-05-14 DIAGNOSIS — Z6841 Body Mass Index (BMI) 40.0 and over, adult: Secondary | ICD-10-CM

## 2018-05-14 DIAGNOSIS — E559 Vitamin D deficiency, unspecified: Secondary | ICD-10-CM

## 2018-05-14 MED ORDER — LOSARTAN POTASSIUM 25 MG PO TABS: 25.0000 mg | ORAL_TABLET | Freq: Every day | ORAL | 3 refills | Status: DC

## 2018-05-14 MED ORDER — HYDROCHLOROTHIAZIDE 12.5 MG PO TABS: 12.5000 mg | ORAL_TABLET | Freq: Every day | ORAL | 3 refills | Status: DC

## 2018-05-14 MED ORDER — METFORMIN HCL 500 MG PO TABS: 500.0000 mg | ORAL_TABLET | Freq: Every day | ORAL | 3 refills | Status: DC

## 2018-05-14 NOTE — Progress Notes (Signed)
Chief Complaint  Patient presents with  . Follow-up  . Hypertension   F/u 1. HTN elevated on hctz 12.5 mg qd  2. DM 2 A1C 7.4 disc metformin today 3. Vit D def   Review of Systems  Constitutional: Positive for weight loss.  HENT: Negative for hearing loss.   Eyes: Negative for blurred vision.  Respiratory: Negative for shortness of breath.   Cardiovascular: Negative for chest pain.  Skin: Negative for rash.  Neurological: Negative for headaches.  Psychiatric/Behavioral: Negative for depression.   Past Medical History:  Diagnosis Date  . Hypertension   . Obesity    Past Surgical History:  Procedure Laterality Date  . TONSILLECTOMY     age 18 or 52   . WISDOM TOOTH EXTRACTION     Family History  Problem Relation Age of Onset  . Cancer Mother        breast  . CAD Father   . Emphysema Father   . Heart disease Father        CABG   Social History   Socioeconomic History  . Marital status: Married    Spouse name: Not on file  . Number of children: Not on file  . Years of education: Not on file  . Highest education level: Not on file  Occupational History  . Not on file  Social Needs  . Financial resource strain: Not on file  . Food insecurity:    Worry: Not on file    Inability: Not on file  . Transportation needs:    Medical: Not on file    Non-medical: Not on file  Tobacco Use  . Smoking status: Never Smoker  . Smokeless tobacco: Former Network engineer and Sexual Activity  . Alcohol use: Yes  . Drug use: No  . Sexual activity: Yes  Lifestyle  . Physical activity:    Days per week: Not on file    Minutes per session: Not on file  . Stress: Not on file  Relationships  . Social connections:    Talks on phone: Not on file    Gets together: Not on file    Attends religious service: Not on file    Active member of club or organization: Not on file    Attends meetings of clubs or organizations: Not on file    Relationship status: Not on file  . Intimate  partner violence:    Fear of current or ex partner: Not on file    Emotionally abused: Not on file    Physically abused: Not on file    Forced sexual activity: Not on file  Other Topics Concern  . 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    Former chewing tobacco   Owns guns, wears seat belts, safe in relationship    Current Meds  Medication Sig  . hydrochlorothiazide (HYDRODIURIL) 12.5 MG tablet Take 1 tablet (12.5 mg total) by mouth daily. In am  . triamcinolone cream (KENALOG) 0.1 % Apply 1 application topically 2 (two) times daily. Left leg   No Known Allergies Recent Results (from the past 2160 hour(s))  Measles/Mumps/Rubella Immunity     Status: None   Collection Time: 04/26/18  8:39 AM  Result Value Ref Range   Rubeola IgG 218.00 AU/mL    Comment: AU/mL  Interpretation -----            -------------- <25.00           Negative 25.00-29.99      Equivocal >29.99           Positive . A positive result indicates that the patient has antibody to measles virus. It does not differentiate  between an active or past infection. The clinical  diagnosis must be interpreted in conjunction with  clinical signs and symptoms of the patient.    Mumps IgG 203.00 AU/mL    Comment:  AU/mL           Interpretation -------         ---------------- <9.00             Negative 9.00-10.99        Equivocal >10.99            Positive A positive result indicates that the patient has  antibody to mumps virus. It does not differentiate between an  active or past infection. The clinical diagnosis must be interpreted in conjunction with clinical signs and symptoms of the patient. .    Rubella 2.54 index    Comment:     Index            Interpretation     -----            --------------       <0.90            Not consistent with Immunity     0.90-0.99        Equivocal     > or = 1.00       Consistent with Immunity  . The presence of rubella IgG antibody suggests  immunization or past or current infection with rubella virus.   Hepatitis B surface antibody     Status: None   Collection Time: 04/26/18  8:39 AM  Result Value Ref Range   Hepatitis B-Post 503 > OR = 10 mIU/mL    Comment: . Patient has immunity to hepatitis B virus. . For additional information, please refer to http://education.questdiagnostics.com/faq/FAQ105 (This link is being provided for informational/ educational purposes only).   PSA     Status: None   Collection Time: 04/26/18  8:39 AM  Result Value Ref Range   PSA 0.71 0.10 - 4.00 ng/mL    Comment: Test performed using Access [employer]ch PSA Assay, a parmagnetic partical, chemiluminecent immunoassay.  Vitamin D (25 hydroxy)     Status: Abnormal   Collection Time: 04/26/18  8:39 AM  Result Value Ref Range   VITD 24.50 (L) 30.00 - 100.00 ng/mL  T4, free     Status: None   Collection Time: 04/26/18  8:39 AM  Result Value Ref Range   Free T4 0.85 0.60 - 1.60 ng/dL    Comment: Specimens from patients who are undergoing biotin therapy and /or ingesting biotin supplements may contain high levels of biotin.  The higher biotin concentration in these specimens interferes with this Free T4 assay.  Specimens that contain high levels  of biotin may cause false high results for this Free T4 assay.  Please interpret results in light of the total clinical presentation of the patient.    TSH     Status: None   Collection Time: 04/26/18  8:39 AM  Result Value Ref Range   TSH 4.46 0.35 - 4.50 uIU/mL  Urinalysis, Routine w reflex microscopic     Status:  Abnormal   Collection Time: 04/26/18  8:39 AM  Result Value Ref Range   Specific Gravity, UA 1.020 1.005 - 1.030   pH, UA 5.0 5.0 - 7.5   Color, UA Yellow Yellow   Appearance Ur Clear Clear   Leukocytes, UA Negative Negative   Protein, UA Negative Negative/Trace   Glucose, UA Negative Negative   Ketones,  UA Negative Negative   RBC, UA Trace (A) Negative   Bilirubin, UA Negative Negative   Urobilinogen, Ur 0.2 0.2 - 1.0 mg/dL   Nitrite, UA Negative Negative   Microscopic Examination See below:     Comment: Microscopic was indicated and was performed.  Lipid panel     Status: Abnormal   Collection Time: 04/26/18  8:39 AM  Result Value Ref Range   Cholesterol 152 0 - 200 mg/dL    Comment: ATP III Classification       Desirable:  < 200 mg/dL               Borderline High:  200 - 239 mg/dL          High:  > = 240 mg/dL   Triglycerides 153.0 (H) 0.0 - 149.0 mg/dL    Comment: Normal:  <150 mg/dLBorderline High:  150 - 199 mg/dL   HDL 40.40 >39.00 mg/dL   VLDL 30.6 0.0 - 40.0 mg/dL   LDL Cholesterol 81 0 - 99 mg/dL   Total CHOL/HDL Ratio 4     Comment:                Men          Women1/2 Average Risk     3.4          3.3Average Risk          5.0          4.42X Average Risk          9.6          7.13X Average Risk          15.0          11.0                       NonHDL 111.32     Comment: NOTE:  Non-HDL goal should be 30 mg/dL higher than patient's LDL goal (i.e. LDL goal of < 70 mg/dL, would have non-HDL goal of < 100 mg/dL)  CBC with Differential/Platelet     Status: None   Collection Time: 04/26/18  8:39 AM  Result Value Ref Range   WBC 6.8 4.0 - 10.5 K/uL   RBC 5.48 4.22 - 5.81 Mil/uL   Hemoglobin 16.5 13.0 - 17.0 g/dL   HCT 47.8 39.0 - 52.0 %   MCV 87.2 78.0 - 100.0 fl   MCHC 34.4 30.0 - 36.0 g/dL   RDW 13.5 11.5 - 15.5 %   Platelets 267.0 150.0 - 400.0 K/uL   Neutrophils Relative % 52.4 43.0 - 77.0 %   Lymphocytes Relative 34.3 12.0 - 46.0 %   Monocytes Relative 10.3 3.0 - 12.0 %   Eosinophils Relative 2.6 0.0 - 5.0 %   Basophils Relative 0.4 0.0 - 3.0 %   Neutro Abs 3.6 1.4 - 7.7 K/uL   Lymphs Abs 2.3 0.7 - 4.0 K/uL   Monocytes Absolute 0.7 0.1 - 1.0 K/uL   Eosinophils Absolute 0.2 0.0 - 0.7 K/uL   Basophils Absolute 0.0 0.0 - 0.1 K/uL  Comprehensive metabolic  panel      Status: Abnormal   Collection Time: 04/26/18  8:39 AM  Result Value Ref Range   Sodium 139 135 - 145 mEq/L   Potassium 4.6 3.5 - 5.1 mEq/L   Chloride 99 96 - 112 mEq/L   CO2 30 19 - 32 mEq/L   Glucose, Bld 142 (H) 70 - 99 mg/dL   BUN 16 6 - 23 mg/dL   Creatinine, Ser 1.15 0.40 - 1.50 mg/dL   Total Bilirubin 0.8 0.2 - 1.2 mg/dL   Alkaline Phosphatase 107 39 - 117 U/L   AST 18 0 - 37 U/L   ALT 25 0 - 53 U/L   Total Protein 6.9 6.0 - 8.3 g/dL   Albumin 4.2 3.5 - 5.2 g/dL   Calcium 9.4 8.4 - 10.5 mg/dL   GFR 71.01 >60.00 mL/min  Microscopic Examination     Status: None   Collection Time: 04/26/18  8:39 AM  Result Value Ref Range   WBC, UA 0-5 0 - 5 /hpf   RBC, UA 0-2 0 - 2 /hpf   Epithelial Cells (non renal) None seen 0 - 10 /hpf   Casts None seen None seen /lpf   Mucus, UA Present Not Estab.   Bacteria, UA None seen None seen/Few  Hemoglobin A1c     Status: Abnormal   Collection Time: 04/27/18  2:50 PM  Result Value Ref Range   Hgb A1c MFr Bld 7.4 (H) 4.6 - 6.5 %    Comment: Glycemic Control Guidelines for People with Diabetes:Non Diabetic:  <6%Goal of Therapy: <7%Additional Action Suggested:  >8%    Objective  Body mass index is 47.52 kg/m. Wt Readings from Last 3 Encounters:  05/14/18 (!) 331 lb 3.2 oz (150.2 kg)  04/21/18 (!) 334 lb 6.4 oz (151.7 kg)  01/24/16 300 lb (136.1 kg)   Temp Readings from Last 3 Encounters:  05/14/18 (!) 97.5 F (36.4 C) (Oral)  04/21/18 98.2 F (36.8 C) (Oral)  01/24/16 98.2 F (36.8 C) (Oral)   BP Readings from Last 3 Encounters:  05/14/18 (!) 138/96  04/21/18 (!) 156/80  01/24/16 (!) 153/94   Pulse Readings from Last 3 Encounters:  05/14/18 88  04/21/18 (!) 109  01/24/16 97    Physical Exam  Constitutional: He is oriented to person, place, and time. He appears well-developed and well-nourished. He is cooperative.  HENT:  Head: Normocephalic and atraumatic.  Mouth/Throat: Oropharynx is clear and moist and mucous membranes  are normal.  Eyes: Pupils are equal, round, and reactive to light. Conjunctivae are normal.  Cardiovascular: Normal rate, regular rhythm and normal heart sounds.  Pulmonary/Chest: Effort normal and breath sounds normal.  Genitourinary: Rectum normal and prostate normal. Prostate is not enlarged and not tender.  Neurological: He is alert and oriented to person, place, and time. Gait normal.  Skin: Skin is warm, dry and intact.  Psychiatric: He has a normal mood and affect. His speech is normal and behavior is normal. Judgment and thought content normal. Cognition and memory are normal.  Nursing note and vitals reviewed.   Assessment   1. HTN  2. DM 2 A1C 7.4  3. Vit D def  4. HM Plan   1. Add losartan 25 to hctz 12.5  Consider sleep study in future 2.  rec healthy diet choices and exercise  Check A1C at f/u  Start metformin 500 mg qd  [facility] due 09/2018  Foot exam future 3. rec take 2000 IU D3 qd  4.  Had flu shot  Tdap utd  Disc shingrix in future  Protected hep B, MMR status  declines std check   PSA nl and DRE normal today Referred to [facility] Dr. Allen Norris screening colonoscopy sch 05/2018 No need for dermatology referral now consider in future  Used to chew chewing tobacco   [facility] saw 2018 wears glasses    Provider: Dr. Olivia Mackie McLean-Scocuzza-Internal Medicine

## 2018-05-14 NOTE — Patient Instructions (Addendum)
Chia seeds or Flax seeds  Zevia soda in moderation  F/u in 3-4 months  Think about about Patty vision A1C 7.4=diabetes   Exercising to Lose Weight Exercising can help you to lose weight. In order to lose weight through exercise, you need to do vigorous-intensity exercise. You can tell that you are exercising with vigorous intensity if you are breathing very hard and fast and cannot hold a conversation while exercising. Moderate-intensity exercise helps to maintain your current weight. You can tell that you are exercising at a moderate level if you have a higher heart rate and faster breathing, but you are still able to hold a conversation. How often should I exercise? Choose an activity that you enjoy and set realistic goals. Your health care provider can help you to make an activity plan that works for you. Exercise regularly as directed by your health care provider. This may include:  Doing resistance training twice each week, such as: ? Push-ups. ? Sit-ups. ? Lifting weights. ? Using resistance bands.  Doing a given intensity of exercise for a given amount of time. Choose from these options: ? 150 minutes of moderate-intensity exercise every week. ? 75 minutes of vigorous-intensity exercise every week. ? A mix of moderate-intensity and vigorous-intensity exercise every week.  Children, pregnant women, people who are out of shape, people who are overweight, and older adults may need to consult a health care provider for individual recommendations. If you have any sort of medical condition, be sure to consult your health care provider before starting a new exercise program. What are some activities that can help me to lose weight?  Walking at a rate of at least 4.5 miles an hour.  Jogging or running at a rate of 5 miles per hour.  Biking at a rate of at least 10 miles per hour.  Lap swimming.  Roller-skating or in-line skating.  Cross-country skiing.  Vigorous competitive  sports, such as football, basketball, and soccer.  Jumping rope.  Aerobic dancing. How can I be more active in my day-to-day activities?  Use the stairs instead of the elevator.  Take a walk during your lunch break.  If you drive, park your car farther away from work or school.  If you take public transportation, get off one stop early and walk the rest of the way.  Make all of your phone calls while standing up and walking around.  Get up, stretch, and walk around every 30 minutes throughout the day. What guidelines should I follow while exercising?  Do not exercise so much that you hurt yourself, feel dizzy, or get very short of breath.  Consult your health care provider prior to starting a new exercise program.  Wear comfortable clothes and shoes with good support.  Drink plenty of water while you exercise to prevent dehydration or heat stroke. Body water is lost during exercise and must be replaced.  Work out until you breathe faster and your heart beats faster. This information is not intended to replace advice given to you by your health care provider. Make sure you discuss any questions you have with your health care provider. Document Released: 11/08/2010 Document Revised: 03/13/2016 Document Reviewed: 03/09/2014 Elsevier Interactive Patient Education  2018 [employer].  Eating Healthy on a Budget There are many ways to save money at the grocery store and continue to eat healthy. You can be successful if you plan your meals according to your budget, purchase according to your budget and grocery list, and prepare  food yourself. How can I buy more food on a limited budget? Plan  Plan meals and snacks according to a grocery list and budget you create.  Look for recipes where you can cook once and make enough food for two meals.  Include meals that will "stretch" more expensive foods such as stews, casseroles, and stir-fry dishes.  Make a grocery list and make sure  to bring it with you to the store. If you have a smart phone, you could use your phone to create your shopping list. Purchase  When grocery shopping, buy only the items on your grocery list and go only to the areas of the store that have the items on your list. Prepare  Some meal items can be prepared in advance. Pre-cook on days when you have extra time.  Make extra food (such as by doubling recipes) and freeze the extras in meal-sized containers or in individual portions for fast meals and snacks.  Use leftovers in your meal plan for the week.  Try some meatless meals or try "no cook" meals like salads.  When you come home from the grocery store, wash and prepare your fruits and vegetables so they are ready to use and eat. This will help reduce food waste. How can I buy more food on a limited budget? Try these tips the next time you go shopping:  [place] store brands or generic brands.  Use coupons only for foods and brands you normally buy. Avoid buying items you wouldn't normally buy simply because they are on sale.  Check online and in newspapers for weekly deals.  Buy healthy items from the bulk bins when available, such as herbs, spices, flours, pastas, nuts, and dried fruit.  Buy fruits and vegetables that are in season. Prices are usually lower on in-season produce.  Compare and contrast different items. You can do this by looking at the unit price on the price tag. Use it to compare different brands and sizes to find out which item is the best deal.  Choose naturally low-cost healthy items, such as carrots, potatoes, apples, bananas, and oranges. Dried or canned beans are a low-cost protein source.  Buy in bulk and freeze extra food. Items you can buy in bulk include meats, fish, poultry, frozen fruits, and frozen vegetables.  Limit the purchase of prepared or "ready-to-eat" foods, such as pre-cut fruits and vegetables and pre-made salads.  If possible, shop around to  discover which grocery store offers the best prices. Some stores charge much more than other stores for the same items.  Do not shop when you are hungry. If you shop while hungry, It may be hard to stick to your list and budget.  Stick to your list and resist impulse buys. Treat your list as your official plan for the week.  Buy a variety of vegetables and fruit by purchasing fresh, frozen, and canned items.  Look beyond eye level. Foods at eye level (adult or child eye level) are more expensive. Look at the top and bottom shelves for deals.  Be efficient with your time when shopping. The more time you spend at the store, the more money you are likely to spend.  Consider other retailers such as dollar stores, larger [employer], local fruit and vegetable stands, and farmers markets.  What are some tips for less expensive food substitutions? When choosing more expensive foods like meats and dairy, try these tips to save money:  Choose cheaper cuts of meat, such as bone-in  chicken thighs and drumsticks instead skinless and boneless chicken. When you are ready to prepare the chicken, you can remove the skin yourself to make it healthier.  Choose lean meats like chicken or [place]. When choosing ground beef, make sure it is lean ground beef (92% lean, 8% fat). If you do buy a fattier ground beef, drain the fat before eating.  Buy dried beans and peas, such as lentils, split peas, or kidney beans.  For seafood, choose canned tuna, salmon, or sardines.  Eggs are a low-cost source of protein.  Buy the larger tubs of yogurt instead of individual-sized containers.  Choose water instead of sodas and other sweetened beverages.  Skip buying chips, cookies, and other "junk food". These items are usually expensive, high in calories, and low in nutritional value.  How can I prepare the foods I buy in the healthiest way? Practice these tips for cooking foods in the healthiest way to reduce  excess fat and calorie intake:  Steam, saute, grill, or bake foods instead of frying them.  Make sure half your plate is filled with fruits or vegetables. Choose from fresh, frozen, or canned fruits and vegetables. If eating canned, remember to rinse them before eating. This will remove any excess salt added for packaging.  Trim all fat from meat before cooking. Remove the skin from chicken or [place].  Spoon off fat from meat dishes once they have been chilled in the refrigerator and the fat has hardened on the top.  Use skim milk, low-fat milk, or evaporated skim milk when making cream sauces, soups, or puddings.  Substitute low-fat yogurt, sour cream, or cottage cheese for sour cream and mayonnaise in dips and dressings.  Try lemon juice, herbs, or spices to season food instead of salt, butter, or margarine.  This information is not intended to replace advice given to you by your health care provider. Make sure you discuss any questions you have with your health care provider. Document Released: 06/09/2014 Document Revised: 04/25/2016 Document Reviewed: 05/09/2014 Elsevier Interactive Patient Education  2018 [employer].  Diabetes Mellitus and Nutrition When you have diabetes (diabetes mellitus), it is very important to have healthy eating habits because your blood sugar (glucose) levels are greatly affected by what you eat and drink. Eating healthy foods in the appropriate amounts, at about the same times every day, can help you:  Control your blood glucose.  Lower your risk of heart disease.  Improve your blood pressure.  Reach or maintain a healthy weight.  Every person with diabetes is different, and each person has different needs for a meal plan. Your health care provider may recommend that you work with a diet and nutrition specialist (dietitian) to make a meal plan that is best for you. Your meal plan may vary depending on factors such as:  The calories you need.  The  medicines you take.  Your weight.  Your blood glucose, blood pressure, and cholesterol levels.  Your activity level.  Other health conditions you have, such as heart or kidney disease.  How do carbohydrates affect me? Carbohydrates affect your blood glucose level more than any other type of food. Eating carbohydrates naturally increases the amount of glucose in your blood. Carbohydrate counting is a method for keeping track of how many carbohydrates you eat. Counting carbohydrates is important to keep your blood glucose at a healthy level, especially if you use insulin or take certain oral diabetes medicines. It is important to know how many carbohydrates you can  safely have in each meal. This is different for every person. Your dietitian can help you calculate how many carbohydrates you should have at each meal and for snack. Foods that contain carbohydrates include:  Bread, cereal, rice, pasta, and crackers.  Potatoes and corn.  Peas, beans, and lentils.  Milk and yogurt.  Fruit and juice.  Desserts, such as cakes, cookies, ice cream, and candy.  How does alcohol affect me? Alcohol can cause a sudden decrease in blood glucose (hypoglycemia), especially if you use insulin or take certain oral diabetes medicines. Hypoglycemia can be a life-threatening condition. Symptoms of hypoglycemia (sleepiness, dizziness, and confusion) are similar to symptoms of having too much alcohol. If your health care provider says that alcohol is safe for you, follow these guidelines:  Limit alcohol intake to no more than 1 drink per day for nonpregnant women and 2 drinks per day for men. One drink equals 12 oz of beer, 5 oz of wine, or 1 oz of hard liquor.  Do not drink on an empty stomach.  Keep yourself hydrated with water, diet soda, or unsweetened iced tea.  Keep in mind that regular soda, juice, and other mixers may contain a lot of sugar and must be counted as carbohydrates.  What are tips  for following this plan? Reading food labels  Start by checking the serving size on the label. The amount of calories, carbohydrates, fats, and other nutrients listed on the label are based on one serving of the food. Many foods contain more than one serving per package.  Check the total grams (g) of carbohydrates in one serving. You can calculate the number of servings of carbohydrates in one serving by dividing the total carbohydrates by 15. For example, if a food has 30 g of total carbohydrates, it would be equal to 2 servings of carbohydrates.  Check the number of grams (g) of saturated and trans fats in one serving. Choose foods that have low or no amount of these fats.  Check the number of milligrams (mg) of sodium in one serving. Most people should limit total sodium intake to less than 2,300 mg per day.  Always check the nutrition information of foods labeled as "low-fat" or "nonfat". These foods may be higher in added sugar or refined carbohydrates and should be avoided.  Talk to your dietitian to identify your daily goals for nutrients listed on the label. Shopping  Avoid buying canned, premade, or processed foods. These foods tend to be high in fat, sodium, and added sugar.  Shop around the outside edge of the grocery store. This includes fresh fruits and vegetables, bulk grains, fresh meats, and fresh dairy. Cooking  Use low-heat cooking methods, such as baking, instead of high-heat cooking methods like deep frying.  Cook using healthy oils, such as olive, canola, or sunflower oil.  Avoid cooking with butter, cream, or high-fat meats. Meal planning  Eat meals and snacks regularly, preferably at the same times every day. Avoid going long periods of time without eating.  Eat foods high in fiber, such as fresh fruits, vegetables, beans, and whole grains. Talk to your dietitian about how many servings of carbohydrates you can eat at each meal.  Eat 4-6 ounces of lean protein  each day, such as lean meat, chicken, fish, eggs, or tofu. 1 ounce is equal to 1 ounce of meat, chicken, or fish, 1 egg, or 1/4 cup of tofu.  Eat some foods each day that contain healthy fats, such as avocado, nuts,  seeds, and fish. Lifestyle   Check your blood glucose regularly.  Exercise at least 30 minutes 5 or more days each week, or as told by your health care provider.  Take medicines as told by your health care provider.  Do not use any products that contain nicotine or tobacco, such as cigarettes and e-cigarettes. If you need help quitting, ask your health care provider.  Work with a Veterinary surgeon or diabetes educator to identify strategies to manage stress and any emotional and social challenges. What are some questions to ask my health care provider?  Do I need to meet with a diabetes educator?  Do I need to meet with a dietitian?  What number can I call if I have questions?  When are the best times to check my blood glucose? Where to find more information:  American Diabetes Association: diabetes.org/food-and-fitness/food  Academy of Nutrition and Dietetics: https://www.vargas.com/  [employer] of Diabetes and Digestive and Kidney Diseases (NIH): FindJewelers.cz Summary  A healthy meal plan will help you control your blood glucose and maintain a healthy lifestyle.  Working with a diet and nutrition specialist (dietitian) can help you make a meal plan that is best for you.  Keep in mind that carbohydrates and alcohol have immediate effects on your blood glucose levels. It is important to count carbohydrates and to use alcohol carefully. This information is not intended to replace advice given to you by your health care provider. Make sure you discuss any questions you have with your health care provider. Document Released: 07/03/2005 Document Revised:  11/10/2016 Document Reviewed: 11/10/2016 Elsevier Interactive Patient Education  [employer].

## 2018-05-14 NOTE — Progress Notes (Signed)
Pre visit review using our clinic review tool, if applicable. No additional management support is needed unless otherwise documented below in the visit note. 

## 2018-05-17 MED ORDER — HYDROCHLOROTHIAZIDE 12.5 MG PO TABS: 12.5000 mg | ORAL_TABLET | Freq: Every day | ORAL | 3 refills | Status: DC

## 2018-06-17 ENCOUNTER — Encounter: Payer: Self-pay | Admitting: Student

## 2018-06-18 ENCOUNTER — Ambulatory Visit: Payer: Managed Care, Other (non HMO) | Admitting: Anesthesiology

## 2018-06-18 ENCOUNTER — Encounter
Admission: RE | Disposition: A | Discharge status: Home / Self Care | Payer: Self-pay | Source: Ambulatory Visit | Attending: Gastroenterology

## 2018-06-18 ENCOUNTER — Ambulatory Visit
Admission: RE | Admit: 2018-06-18 | Discharge: 2018-06-18 | Disposition: A | Discharge status: Home / Self Care | Payer: Managed Care, Other (non HMO) | Source: Ambulatory Visit | Attending: Gastroenterology | Admitting: Gastroenterology

## 2018-06-18 ENCOUNTER — Encounter: Payer: Self-pay | Admitting: Anesthesiology

## 2018-06-18 DIAGNOSIS — Z7984 Long term (current) use of oral hypoglycemic drugs: Secondary | ICD-10-CM | POA: Insufficient documentation

## 2018-06-18 DIAGNOSIS — Z1211 Encounter for screening for malignant neoplasm of colon: Secondary | ICD-10-CM | POA: Diagnosis present

## 2018-06-18 DIAGNOSIS — Z87891 Personal history of nicotine dependence: Secondary | ICD-10-CM | POA: Insufficient documentation

## 2018-06-18 DIAGNOSIS — I10 Essential (primary) hypertension: Secondary | ICD-10-CM | POA: Diagnosis not present

## 2018-06-18 DIAGNOSIS — Q438 Other specified congenital malformations of intestine: Secondary | ICD-10-CM

## 2018-06-18 DIAGNOSIS — Z79899 Other long term (current) drug therapy: Secondary | ICD-10-CM | POA: Insufficient documentation

## 2018-06-18 DIAGNOSIS — E119 Type 2 diabetes mellitus without complications: Secondary | ICD-10-CM | POA: Insufficient documentation

## 2018-06-18 DIAGNOSIS — Z6841 Body Mass Index (BMI) 40.0 and over, adult: Secondary | ICD-10-CM | POA: Diagnosis not present

## 2018-06-18 HISTORY — PX: COLONOSCOPY WITH PROPOFOL: SHX5780

## 2018-06-18 LAB — GLUCOSE, CAPILLARY: Glucose-Capillary: 125 mg/dL — ABNORMAL HIGH (ref 70–99)

## 2018-06-18 SURGERY — COLONOSCOPY WITH PROPOFOL
Anesthesia: General

## 2018-06-18 MED ORDER — SODIUM CHLORIDE 0.9 % IV SOLN
INTRAVENOUS | Status: DC
  Administered 2018-06-18: 1000 mL via INTRAVENOUS

## 2018-06-18 MED ORDER — PROPOFOL 10 MG/ML IV BOLUS
INTRAVENOUS | Status: DC | PRN
  Administered 2018-06-18: 100 mg via INTRAVENOUS

## 2018-06-18 MED ORDER — PROPOFOL 500 MG/50ML IV EMUL
INTRAVENOUS | Status: AC
  Filled 2018-06-18: qty 50

## 2018-06-18 MED ORDER — PROPOFOL 500 MG/50ML IV EMUL
INTRAVENOUS | Status: DC | PRN
  Administered 2018-06-18: 140 ug/kg/min via INTRAVENOUS

## 2018-06-18 NOTE — Op Note (Signed)
[facility] Gastroenterology Patient Name: Jonathan [name] Procedure Date: 06/18/2018 10:40 AM MRN: [id number] Account #: [id number] Date of Birth: 16-Feb-1966 Admit Type: Outpatient Age: 52 Room: [facility] ENDO ROOM 2 Gender: Male Note Status: Finalized Procedure:            Colonoscopy Indications:          Screening for colorectal malignant neoplasm Providers:            Varnita B. Maximino Greenland MD, MD Referring MD:         Pasty Spillers Mclean-Scocuzza MD, MD (Referring MD) Medicines:            Monitored Anesthesia Care Complications:        No immediate complications. Procedure:            Pre-Anesthesia Assessment:                       - Prior to the procedure, a History and Physical was                        performed, and patient medications, allergies and                        sensitivities were reviewed. The patient's tolerance of                        previous anesthesia was reviewed.                       - The risks and benefits of the procedure and the                        sedation options and risks were discussed with the                        patient. All questions were answered and informed                        consent was obtained.                       - Patient identification and proposed procedure were                        verified prior to the procedure by the physician, the                        nurse, the anesthetist and the technician. The                        procedure was verified in the pre-procedure area in the                        procedure room in the endoscopy suite.                       - ASA Grade Assessment: II - A patient with mild                        systemic disease.                       -  After reviewing the risks and benefits, the patient                        was deemed in satisfactory condition to undergo the                        procedure.                       After obtaining informed consent, the colonoscope  was                        passed under direct vision. Throughout the procedure,                        the patient's blood pressure, pulse, and oxygen                        saturations were monitored continuously. The                        Colonoscope was introduced through the anus and                        advanced to the the cecum, identified by appendiceal                        orifice and ileocecal valve. The colonoscopy was                        performed with ease. The patient tolerated the                        procedure well. The quality of the bowel preparation                        was good. Findings:      The perianal and digital rectal examinations were normal.      The colon (entire examined portion) was mildly tortuous. Advancing the       scope required changing the patient to a supine position. Advancing the       scope required withdrawing and reinserting the scope. Advancing the       scope required applying abdominal pressure. This was due to the       patient's obese abdomen.      The rectum, sigmoid colon, descending colon, transverse colon, ascending       colon and cecum appeared normal.      The retroflexed view of the distal rectum and anal verge was normal and       showed no anal or rectal abnormalities. Impression:           - Tortuous colon.                       - The rectum, sigmoid colon, descending colon,                        transverse colon, ascending colon and cecum are normal.                       - The distal rectum and  anal verge are normal on                        retroflexion view.                       - No specimens collected. Recommendation:       - Discharge patient to home.                       - Resume previous diet.                       - Continue present medications.                       - Repeat colonoscopy in 5 years.                       - Return to primary care physician as previously                         scheduled.                       - The findings and recommendations were discussed with                        the patient.                       - The findings and recommendations were discussed with                        the patient's family. Procedure Code(s):    --- Professional ---                       [id number], Colorectal cancer screening; colonoscopy on                        individual not meeting criteria for high risk Diagnosis Code(s):    --- Professional ---                       Z12.11, Encounter for screening for malignant neoplasm                        of colon                       Q43.8, Other specified congenital malformations of                        intestine CPT copyright 2017 American Medical Association. All rights reserved. The codes documented in this report are preliminary and upon coder review may  be revised to meet current compliance requirements.  [name], MD Michel BickersVarnita B. Maximino Greenlandahiliani MD, MD 06/18/2018 11:31:59 AM This report has been signed electronically. Number of Addenda: 0 Note Initiated On: 06/18/2018 10:40 AM Scope Withdrawal Time: 0 hours 15 minutes 6 seconds  Total Procedure Duration: 0 hours 42 minutes 42 seconds  Estimated Blood Loss: Estimated blood loss: none.      [employer] [facility]

## 2018-06-18 NOTE — Anesthesia Preprocedure Evaluation (Signed)
Anesthesia Evaluation  Patient identified by MRN, date of birth, ID band Patient awake    Reviewed: Allergy & Precautions, H&P , NPO status , Patient's Chart, lab work & pertinent test results  History of Anesthesia Complications Negative for: history of anesthetic complications  Airway Mallampati: II  TM Distance: >3 FB Neck ROM: full    Dental  (+) Chipped   Pulmonary  Signs and symptoms suggestive of sleep apnea            Cardiovascular Exercise Tolerance: Good hypertension, (-) angina(-) Past MI and (-) DOE      Neuro/Psych negative neurological ROS  negative psych ROS   GI/Hepatic negative GI ROS, Neg liver ROS,   Endo/Other  diabetes, Type 2Morbid obesity  Renal/GU negative Renal ROS  negative genitourinary   Musculoskeletal   Abdominal   Peds  Hematology negative hematology ROS (+)   Anesthesia Other Findings Past Medical History: No date: Diabetes mellitus without complication (HCC) No date: Hypertension No date: Obesity  Past Surgical History: No date: TONSILLECTOMY     Comment:  age 52 or 4  No date: WISDOM TOOTH EXTRACTION  BMI    Body Mass Index:  47.92 kg/m      Reproductive/Obstetrics negative OB ROS                             Anesthesia Physical Anesthesia Plan  ASA: III  Anesthesia Plan: General   Post-op Pain Management:    Induction: Intravenous  PONV Risk Score and Plan: Propofol infusion and TIVA  Airway Management Planned: Natural Airway and Nasal Cannula  Additional Equipment:   Intra-op Plan:   Post-operative Plan:   Informed Consent: I have reviewed the patients History and Physical, chart, labs and discussed the procedure including the risks, benefits and alternatives for the proposed anesthesia with the patient or authorized representative who has indicated his/her understanding and acceptance.   Dental Advisory Given  Plan  Discussed with: Anesthesiologist, CRNA and Surgeon  Anesthesia Plan Comments: (Patient consented for risks of anesthesia including but not limited to:  - adverse reactions to medications - risk of intubation if required - damage to teeth, lips or other oral mucosa - sore throat or hoarseness - Damage to heart, brain, lungs or loss of life  Patient voiced understanding.)        Anesthesia Quick Evaluation

## 2018-06-18 NOTE — Anesthesia Postprocedure Evaluation (Signed)
Anesthesia Post Note  Patient: Jonathan [name]  Procedure(s) Performed: COLONOSCOPY WITH PROPOFOL (N/A )  Patient location during evaluation: Endoscopy Anesthesia Type: General Level of consciousness: awake and alert Pain management: pain level controlled Vital Signs Assessment: post-procedure vital signs reviewed and stable Respiratory status: spontaneous breathing, nonlabored ventilation, respiratory function stable and patient connected to nasal cannula oxygen Cardiovascular status: blood pressure returned to baseline and stable Postop Assessment: no apparent nausea or vomiting Anesthetic complications: no     Last Vitals:  Vitals:   06/18/18 1140 06/18/18 1150  BP: 134/89 132/85  Pulse:    Resp:    Temp:    SpO2:      Last Pain:  Vitals:   06/18/18 1150  TempSrc:   PainSc: 0-No pain                 Cleda MccreedyJoseph K Vicent Febles

## 2018-06-18 NOTE — H&P (Signed)
Jonathan BouillonVarnita Tahiliani, MD [address], [address], [place], [place], [id number] [address], [address], [place], [place], [address] Phone: [phone]  Fax: [phone]  Primary Care Physician:  McLean-Scocuzza, Pasty Spillersracy N, MD   Pre-Procedure History & Physical: HPI:  Jonathan GamesHarold D Volker is a 52 y.o. male is here for a colonoscopy.   Past Medical History:  Diagnosis Date  . Diabetes mellitus without complication (HCC)   . Hypertension   . Obesity     Past Surgical History:  Procedure Laterality Date  . TONSILLECTOMY     age 33 or 4   . WISDOM TOOTH EXTRACTION      Prior to Admission medications   Medication Sig Start Date End Date Taking? Authorizing Provider  hydrochlorothiazide (HYDRODIURIL) 12.5 MG tablet Take 1 tablet (12.5 mg total) by mouth daily. In am 05/17/18  Yes McLean-Scocuzza, Pasty Spillersracy N, MD  losartan (COZAAR) 25 MG tablet Take 1 tablet (25 mg total) by mouth daily. In am 05/14/18  Yes McLean-Scocuzza, Pasty Spillersracy N, MD  metFORMIN (GLUCOPHAGE) 500 MG tablet Take 1 tablet (500 mg total) by mouth daily with breakfast. 05/14/18  Yes McLean-Scocuzza, Pasty Spillersracy N, MD  triamcinolone cream (KENALOG) 0.1 % Apply 1 application topically 2 (two) times daily. Left leg 04/21/18  Yes McLean-Scocuzza, Pasty Spillersracy N, MD    Allergies as of 04/21/2018  . (No Known Allergies)    Family History  Problem Relation Age of Onset  . Cancer Mother        breast  . CAD Father   . Emphysema Father   . Heart disease Father        CABG    Social History   Socioeconomic History  . Marital status: Married    Spouse name: Not on file  . Number of children: Not on file  . Years of education: Not on file  . Highest education level: Not on file  Occupational History  . Not on file  Social Needs  . Financial resource strain: Not on file  . Food insecurity:    Worry: Not on file    Inability: Not on file  . Transportation needs:    Medical: Not on file    Non-medical: Not on file  Tobacco Use  . Smoking  status: Never Smoker  . Smokeless tobacco: Former Engineer, waterUser  Substance and Sexual Activity  . Alcohol use: Yes  . Drug use: No  . Sexual activity: Yes  Lifestyle  . Physical activity:    Days per week: Not on file    Minutes per session: Not on file  . Stress: Not on file  Relationships  . Social connections:    Talks on phone: Not on file    Gets together: Not on file    Attends religious service: Not on file    Active member of club or organization: Not on file    Attends meetings of clubs or organizations: Not on file    Relationship status: Not on file  . Intimate partner violence:    Fear of current or ex partner: Not on file    Emotionally abused: Not on file    Physically abused: Not on file    Forced sexual activity: Not on file  Other Topics Concern  . 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    Former chewing tobacco  Owns guns, wears seat belts, safe in relationship     Review of Systems: See HPI, otherwise negative ROS  Physical Exam: BP 117/88   Pulse 88   Temp (!) 95.6 F (35.3 C) (Tympanic)   Resp 20   Ht 5\' 10"  (1.778 m)   Wt (!) 151.5 kg   SpO2 98%   BMI 47.92 kg/m  General:   Alert,  pleasant and cooperative in NAD Head:  Normocephalic and atraumatic. Neck:  Supple; no masses or thyromegaly. Lungs:  Clear throughout to auscultation, normal respiratory effort.    Heart:  +S1, +S2, Regular rate and rhythm, No edema. Abdomen:  Soft, nontender and nondistended. Normal bowel sounds, without guarding, and without rebound.   Neurologic:  Alert and  oriented x4;  grossly normal neurologically.  Impression/Plan: Jonathan [name] is here for a colonoscopy to be performed for average risk screening.  Risks, benefits, limitations, and alternatives regarding  colonoscopy have been reviewed with the patient.  Questions have been answered.  All parties  agreeable.   Pasty Spillers, MD  06/18/2018, 10:34 AM

## 2018-06-18 NOTE — Anesthesia Post-op Follow-up Note (Signed)
Anesthesia QCDR form completed.        

## 2018-06-18 NOTE — Transfer of Care (Signed)
Immediate Anesthesia Transfer of Care Note  Patient: Jonathan [name]  Procedure(s) Performed: COLONOSCOPY WITH PROPOFOL (N/A )  Patient Location: Endoscopy Unit  Anesthesia Type:General  Level of Consciousness: sedated  Airway & Oxygen Therapy: Patient Spontanous Breathing and Patient connected to nasal cannula oxygen  Post-op Assessment: Report given to RN and Post -op Vital signs reviewed and stable  Post vital signs: Reviewed and stable  Last Vitals:  Vitals Value Taken Time  BP    Temp    Pulse 100 06/18/2018 11:30 AM  Resp 15 06/18/2018 11:30 AM  SpO2 92 % 06/18/2018 11:30 AM  Vitals shown include unvalidated device data.  Last Pain:  Vitals:   06/18/18 1017  TempSrc: Tympanic  PainSc: 0-No pain         Complications: No apparent anesthesia complications

## 2018-06-22 ENCOUNTER — Encounter: Payer: Self-pay | Admitting: Gastroenterology

## 2018-07-05 ENCOUNTER — Ambulatory Visit
Admission: EM | Admit: 2018-07-05 | Discharge: 2018-07-05 | Disposition: A | Discharge status: Home / Self Care | Payer: Managed Care, Other (non HMO) | Attending: Family Medicine | Admitting: Family Medicine

## 2018-07-05 ENCOUNTER — Other Ambulatory Visit: Payer: Self-pay

## 2018-07-05 ENCOUNTER — Encounter: Payer: Self-pay | Admitting: Emergency Medicine

## 2018-07-05 DIAGNOSIS — R05 Cough: Secondary | ICD-10-CM

## 2018-07-05 DIAGNOSIS — J0100 Acute maxillary sinusitis, unspecified: Secondary | ICD-10-CM

## 2018-07-05 DIAGNOSIS — R059 Cough, unspecified: Secondary | ICD-10-CM

## 2018-07-05 MED ORDER — BENZONATATE 100 MG PO CAPS: 100.0000 mg | ORAL_CAPSULE | Freq: Three times a day (TID) | ORAL | 0 refills | Status: DC | PRN

## 2018-07-05 MED ORDER — DOXYCYCLINE HYCLATE 100 MG PO CAPS: 100.0000 mg | ORAL_CAPSULE | Freq: Two times a day (BID) | ORAL | 0 refills | Status: DC

## 2018-07-05 MED ORDER — HYDROCOD POLST-CPM POLST ER 10-8 MG/5ML PO SUER: 5.0000 mL | Freq: Every evening | ORAL | 0 refills | Status: DC | PRN

## 2018-07-05 NOTE — ED Provider Notes (Signed)
[facility] ____________________________________________  Time seen: Approximately 3:36 PM  I have reviewed the triage vital signs and the nursing notes.   HISTORY  Chief Complaint Sinus Problem  HPI Jonathan [name] is a 52 y.o. male presenting for evaluation of 1 week of runny nose, nasal congestion, intermittent sinus pressure and cough.  Denies any accompanying fevers.  States cough has intermittently disrupted sleep.  Denies much of a sore throat.  States some thick nasal drainage, not persistently.  Denies current sinus pain.  Denies associated chest pain or shortness of breath.  Continues to eat and drink well.  Has tried some over-the-counter Sudafed, Mucinex, cough and congestion combination agents.  Denies other aggravating alleviating factors reports otherwise feels well.  Denies recent sickness.  McLean-Scocuzza, Pasty Spillers, MD: PCP    Past Medical History:  Diagnosis Date  . Diabetes mellitus without complication (HCC)   . Hypertension   . Obesity     Patient Active Problem List   Diagnosis Date Noted  . Encounter for screening colonoscopy   . Tortuous colon   . DM2 (diabetes mellitus, type 2) (HCC) 05/14/2018  . Vitamin D deficiency 05/14/2018  . Essential hypertension 04/21/2018  . Class 3 severe obesity due to excess calories with serious comorbidity and body mass index (BMI) of 45.0 to 49.9 in adult (HCC) 04/21/2018  . Venous stasis dermatitis of left lower extremity 04/21/2018  . Umbilical hernia without obstruction and without gangrene 04/21/2018    Past Surgical History:  Procedure Laterality Date  . COLONOSCOPY WITH PROPOFOL N/A 06/18/2018   Procedure: COLONOSCOPY WITH PROPOFOL;  Surgeon: Pasty Spillers, MD;  Location: [facility] ENDOSCOPY;  Service: Endoscopy;  Laterality: N/A;  . TONSILLECTOMY     age 71 or 4   . WISDOM TOOTH EXTRACTION       No current facility-administered medications for this encounter.   Current Outpatient  Medications:  .  hydrochlorothiazide (HYDRODIURIL) 12.5 MG tablet, Take 1 tablet (12.5 mg total) by mouth daily. In am, Disp: 90 tablet, Rfl: 3 .  losartan (COZAAR) 25 MG tablet, Take 1 tablet (25 mg total) by mouth daily. In am, Disp: 90 tablet, Rfl: 3 .  metFORMIN (GLUCOPHAGE) 500 MG tablet, Take 1 tablet (500 mg total) by mouth daily with breakfast., Disp: 90 tablet, Rfl: 3 .  benzonatate (TESSALON PERLES) 100 MG capsule, Take 1 capsule (100 mg total) by mouth 3 (three) times daily as needed for cough., Disp: 15 capsule, Rfl: 0 .  chlorpheniramine-HYDROcodone (TUSSIONEX PENNKINETIC ER) 10-8 MG/5ML SUER, Take 5 mLs by mouth at bedtime as needed for cough. do not drive or operate machinery while taking as can cause drowsiness., Disp: 50 mL, Rfl: 0 .  doxycycline (VIBRAMYCIN) 100 MG capsule, Take 1 capsule (100 mg total) by mouth 2 (two) times daily., Disp: 20 capsule, Rfl: 0 .  triamcinolone cream (KENALOG) 0.1 %, Apply 1 application topically 2 (two) times daily. Left leg, Disp: 45 g, Rfl: 0  Allergies Patient has no known allergies.  Family History  Problem Relation Age of Onset  . Cancer Mother        breast  . CAD Father   . Emphysema Father   . Heart disease Father        CABG    Social History Social History   Tobacco Use  . Smoking status: Never Smoker  . Smokeless tobacco: Former Engineer, water Use Topics  . Alcohol use: Yes  . Drug use: No    Review  of Systems Constitutional: No fever/chills Eyes: No visual changes. ENT: No sore throat. As above.  Cardiovascular: Denies chest pain. Respiratory: Denies shortness of breath. Gastrointestinal: No abdominal pain.  No nausea, no vomiting. No diarrhea.   Genitourinary: Negative for dysuria. Musculoskeletal: Negative for back pain. Skin: Negative for rash.   ____________________________________________   PHYSICAL EXAM:  VITAL SIGNS: ED Triage Vitals  Enc Vitals Group     BP 07/05/18 1442 (!) 144/102     Pulse  Rate 07/05/18 1442 (!) 108     Resp 07/05/18 1442 17     Temp 07/05/18 1442 98.1 F (36.7 C)     Temp Source 07/05/18 1442 Oral     SpO2 07/05/18 1442 97 %     Weight 07/05/18 1440 (!) 330 lb (149.7 kg)     Height 07/05/18 1440 5\' 10"  (1.778 m)     Head Circumference --      Peak Flow --      Pain Score 07/05/18 1440 0     Pain Loc --      Pain Edu? --      Excl. in GC? --     Vitals:   07/05/18 1440 07/05/18 1442 07/05/18 1520  BP:  (!) 144/102 138/88  Pulse:  (!) 108   Resp:  17   Temp:  98.1 F (36.7 C)   TempSrc:  Oral   SpO2:  97%   Weight: (!) 330 lb (149.7 kg)    Height: 5\' 10"  (1.778 m)      Constitutional: Alert and oriented. Well appearing and in no acute distress. Eyes: Conjunctivae are normal.  Head: Atraumatic. No sinus tenderness to palpation. No swelling. No erythema.  Ears: no erythema, normal TMs bilaterally.   Nose:Nasal congestion with clear rhinorrhea, nasal turbinate erythema and edema.  Mouth/Throat: Mucous membranes are moist. No pharyngeal erythema. No tonsillar swelling or exudate.  Neck: No stridor.  No cervical spine tenderness to palpation. Hematological/Lymphatic/Immunilogical: No cervical lymphadenopathy. Cardiovascular: Normal rate, regular rhythm. Grossly normal heart sounds.  Good peripheral circulation. Respiratory: Normal respiratory effort.  No retractions. No wheezes. Mild scattered rhonchi. Good air movement. Dry intermittent cough noted.  Musculoskeletal: Ambulatory with steady gait.  Neurologic:  Normal speech and language. No gait instability. Skin:  Skin appears warm, dry and intact. No rash noted. Psychiatric: Mood and affect are normal. Speech and behavior are normal.  ___________________________________________   LABS (all labs ordered are listed, but only abnormal results are displayed)  Labs Reviewed - No data to display  PROCEDURES Procedures   INITIAL IMPRESSION / ASSESSMENT AND PLAN / ED COURSE  Pertinent labs &  imaging results that were available during my care of the patient were reviewed by me and considered in my medical decision making (see chart for details).  Well-appearing patient.  No acute distress.  Suspect recent viral upper respiratory infection.  Discussed patient concern of secondary sinusitis.  Patient overall again well-appearing currently, discussed supportive care for at least 2 more days.  Blood pressure improved at recheck, cautioned regarding the multiple over-the-counter combination agents with hypertension.  Will treat with PRN Tessalon Perles and PRN Tussionex and over-the-counter Coricidin.  Rx hardcopy of doxycycline given to start on Wednesday if symptoms not improving.  Encourage rest, fluids, supportive care.Discussed indication, risks and benefits of medications with patient.  Discussed follow up with Primary care physician this week. Discussed follow up and return parameters including no resolution or any worsening concerns. Patient verbalized understanding and agreed to  plan.   ____________________________________________   FINAL CLINICAL IMPRESSION(S) / ED DIAGNOSES  Final diagnoses:  Acute maxillary sinusitis, recurrence not specified  Cough     ED Discharge Orders         Ordered    chlorpheniramine-HYDROcodone (TUSSIONEX PENNKINETIC ER) 10-8 MG/5ML SUER  At bedtime PRN     07/05/18 1521    benzonatate (TESSALON PERLES) 100 MG capsule  3 times daily PRN     07/05/18 1521    doxycycline (VIBRAMYCIN) 100 MG capsule  2 times daily     07/05/18 1521           Note: This dictation was prepared with Dragon dictation along with smaller phrase technology. Any transcriptional errors that result from this process are unintentional.         Renford DillsMiller, Zakiyah Diop, NP 07/05/18 1651

## 2018-07-05 NOTE — Discharge Instructions (Addendum)
Take medication as prescribed. Rest. Drink plenty of fluids. Take over the counter coricidin. Take antibiotic starting Wednesday as discussed.   Follow up with your primary care physician this week as needed. Return to Urgent care for new or worsening concerns.

## 2018-07-05 NOTE — ED Triage Notes (Signed)
Patient c/o sinus congestion and pressure for a week.

## 2018-09-10 ENCOUNTER — Other Ambulatory Visit: Payer: Self-pay | Admitting: Internal Medicine

## 2018-09-10 ENCOUNTER — Encounter: Payer: Self-pay | Admitting: Internal Medicine

## 2018-09-10 ENCOUNTER — Ambulatory Visit (INDEPENDENT_AMBULATORY_CARE_PROVIDER_SITE_OTHER): Payer: Managed Care, Other (non HMO) | Admitting: Internal Medicine

## 2018-09-10 VITALS — BP 142/88 | HR 101 | Temp 98.0°F | Ht 70.0 in | Wt 331.4 lb

## 2018-09-10 DIAGNOSIS — Z0100 Encounter for examination of eyes and vision without abnormal findings: Secondary | ICD-10-CM | POA: Diagnosis not present

## 2018-09-10 DIAGNOSIS — I10 Essential (primary) hypertension: Secondary | ICD-10-CM | POA: Diagnosis not present

## 2018-09-10 DIAGNOSIS — Z6841 Body Mass Index (BMI) 40.0 and over, adult: Secondary | ICD-10-CM

## 2018-09-10 DIAGNOSIS — E119 Type 2 diabetes mellitus without complications: Secondary | ICD-10-CM | POA: Diagnosis not present

## 2018-09-10 DIAGNOSIS — E785 Hyperlipidemia, unspecified: Secondary | ICD-10-CM

## 2018-09-10 DIAGNOSIS — R0683 Snoring: Secondary | ICD-10-CM

## 2018-09-10 LAB — BASIC METABOLIC PANEL
BUN: 18 mg/dL (ref 6–23)
CHLORIDE: 102 meq/L (ref 96–112)
CO2: 27 meq/L (ref 19–32)
CREATININE: 1.08 mg/dL (ref 0.40–1.50)
Calcium: 9.7 mg/dL (ref 8.4–10.5)
GFR: 76.24 mL/min (ref >60.00)
Glucose, Bld: 145 mg/dL — ABNORMAL HIGH (ref 70–99)
POTASSIUM: 3.9 meq/L (ref 3.5–5.1)
Sodium: 138 mEq/L (ref 135–145)

## 2018-09-10 LAB — HEMOGLOBIN A1C: HEMOGLOBIN A1C: 7.6 % — AB (ref 4.6–6.5)

## 2018-09-10 MED ORDER — HYDROCHLOROTHIAZIDE 25 MG PO TABS: 25.0000 mg | ORAL_TABLET | Freq: Every day | ORAL | 3 refills | Status: DC

## 2018-09-10 MED ORDER — METFORMIN HCL 500 MG PO TABS: 500.0000 mg | ORAL_TABLET | Freq: Two times a day (BID) | ORAL | 3 refills | Status: DC

## 2018-09-10 NOTE — Progress Notes (Signed)
Pre visit review using our clinic review tool, if applicable. No additional management support is needed unless otherwise documented below in the visit note. 

## 2018-09-10 NOTE — Patient Instructions (Addendum)
rec [name] exam diabetic 1x per year Jonathan [name]  HCTZ 12.5 take 2 new pill will be 25 mg take 1  Try Cinnamon for blood sugar control as well  Exercising to Lose Weight Exercising can help you to lose weight. In order to lose weight through exercise, you need to do vigorous-intensity exercise. You can tell that you are exercising with vigorous intensity if you are breathing very hard and fast and cannot hold a conversation while exercising. Moderate-intensity exercise helps to maintain your current weight. You can tell that you are exercising at a moderate level if you have a higher heart rate and faster breathing, but you are still able to hold a conversation. How often should I exercise? Choose an activity that you enjoy and set realistic goals. Your health care provider can help you to make an activity plan that works for you. Exercise regularly as directed by your health care provider. This may include:  Doing resistance training twice each week, such as: ? Push-ups. ? Sit-ups. ? Lifting weights. ? Using resistance bands.  Doing a given intensity of exercise for a given amount of time. Choose from these options: ? 150 minutes of moderate-intensity exercise every week. ? 75 minutes of vigorous-intensity exercise every week. ? A mix of moderate-intensity and vigorous-intensity exercise every week.  Children, pregnant women, people who are out of shape, people who are overweight, and older adults may need to consult a health care provider for individual recommendations. If you have any sort of medical condition, be sure to consult your health care provider before starting a new exercise program. What are some activities that can help me to lose weight?  Walking at a rate of at least 4.5 miles an hour.  Jogging or running at a rate of 5 miles per hour.  Biking at a rate of at least 10 miles per hour.  Lap swimming.  Roller-skating or in-line skating.  Cross-country skiing.  Vigorous  competitive sports, such as football, basketball, and soccer.  Jumping rope.  Aerobic dancing. How can I be more active in my day-to-day activities?  Use the stairs instead of the elevator.  Take a walk during your lunch break.  If you drive, park your car farther away from work or school.  If you take public transportation, get off one stop early and walk the rest of the way.  Make all of your phone calls while standing up and walking around.  Get up, stretch, and walk around every 30 minutes throughout the day. What guidelines should I follow while exercising?  Do not exercise so much that you hurt yourself, feel dizzy, or get very short of breath.  Consult your health care provider prior to starting a new exercise program.  Wear comfortable clothes and shoes with good support.  Drink plenty of water while you exercise to prevent dehydration or heat stroke. Body water is lost during exercise and must be replaced.  Work out until you breathe faster and your heart beats faster. This information is not intended to replace advice given to you by your health care provider. Make sure you discuss any questions you have with your health care provider. Document Released: 11/08/2010 Document Revised: 03/13/2016 Document Reviewed: 03/09/2014 Elsevier Interactive Patient Education  [employer]2018 [employer] Inc[employer]

## 2018-09-10 NOTE — Progress Notes (Signed)
Chief Complaint  Patient presents with  . Follow-up   F/u  1. HTN uncontrolled on hctz 12.5, losartan 25 mg qd  2. DM 2 A1C >7 on metformin 500 mg qd at lunch  3. C/o snoring and stopping breathing wife notices, c/w OSA refer to [facility]   Review of Systems  Constitutional: Negative for weight loss.  HENT: Negative for hearing loss.   Eyes: Negative for blurred vision.  Respiratory: Negative for shortness of breath.   Cardiovascular: Negative for chest pain.  Gastrointestinal: Negative for abdominal pain.  Musculoskeletal: Negative for falls.  Skin: Negative for rash.  Neurological: Negative for headaches.  Psychiatric/Behavioral: Negative for depression.   Past Medical History:  Diagnosis Date  . Diabetes mellitus without complication ([place])   . Hypertension   . Obesity    Past Surgical History:  Procedure Laterality Date  . COLONOSCOPY WITH PROPOFOL N/A 06/18/2018   Procedure: COLONOSCOPY WITH PROPOFOL;  Surgeon: Virgel Manifold, MD;  Location: [facility];  Service: Endoscopy;  Laterality: N/A;  . TONSILLECTOMY     age 24 or 62   . WISDOM TOOTH EXTRACTION     Family History  Problem Relation Age of Onset  . Cancer Mother        breast  . CAD Father   . Emphysema Father   . Heart disease Father        CABG   Social History   Socioeconomic History  . Marital status: Married    Spouse name: Not on file  . Number of children: Not on file  . Years of education: Not on file  . Highest education level: Not on file  Occupational History  . Not on file  Social Needs  . Financial resource strain: Not on file  . Food insecurity:    Worry: Not on file    Inability: Not on file  . Transportation needs:    Medical: Not on file    Non-medical: Not on file  Tobacco Use  . Smoking status: Never Smoker  . Smokeless tobacco: Former Network engineer and Sexual Activity  . Alcohol use: Yes  . Drug use: No  . Sexual activity: Yes  Lifestyle  . Physical activity:     Days per week: Not on file    Minutes per session: Not on file  . Stress: Not on file  Relationships  . Social connections:    Talks on phone: Not on file    Gets together: Not on file    Attends religious service: Not on file    Active member of club or organization: Not on file    Attends meetings of clubs or organizations: Not on file    Relationship status: Not on file  . Intimate partner violence:    Fear of current or ex partner: Not on file    Emotionally abused: Not on file    Physically abused: Not on file    Forced sexual activity: Not on file  Other Topics Concern  . 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    Former chewing tobacco   Owns guns, wears seat belts, safe in relationship    Current Meds  Medication Sig  . hydrochlorothiazide (HYDRODIURIL) 12.5 MG tablet Take 1 tablet (12.5 mg total) by mouth daily. In am  . losartan (  COZAAR) 25 MG tablet Take 1 tablet (25 mg total) by mouth daily. In am  . metFORMIN (GLUCOPHAGE) 500 MG tablet Take 1 tablet (500 mg total) by mouth daily with breakfast.  . triamcinolone cream (KENALOG) 0.1 % Apply 1 application topically 2 (two) times daily. Left leg   No Known Allergies Recent Results (from the past 2160 hour(s))  Glucose, capillary     Status: Abnormal   Collection Time: 06/18/18 10:21 AM  Result Value Ref Range   Glucose-Capillary 125 (H) 70 - 99 mg/dL   Objective  Body mass index is 47.55 kg/m. Wt Readings from Last 3 Encounters:  09/10/18 (!) 331 lb 6.4 oz (150.3 kg)  07/05/18 (!) 330 lb (149.7 kg)  06/18/18 (!) 334 lb (151.5 kg)   Temp Readings from Last 3 Encounters:  09/10/18 98 F (36.7 C) (Oral)  07/05/18 98.1 F (36.7 C) (Oral)  06/18/18 (!) 97 F (36.1 C) (Tympanic)   BP Readings from Last 3 Encounters:  09/10/18 (!) 142/88  07/05/18 138/88  06/18/18 132/85   Pulse Readings from Last 3  Encounters:  09/10/18 (!) 101  07/05/18 (!) 108  06/18/18 88    Physical Exam  Constitutional: He is oriented to person, place, and time. Vital signs are normal. He appears well-developed and well-nourished. He is cooperative.  HENT:  Head: Normocephalic and atraumatic.  Mouth/Throat: Oropharynx is clear and moist and mucous membranes are normal.  Eyes: Pupils are equal, round, and reactive to light. Conjunctivae are normal.  Cardiovascular: Normal rate, regular rhythm and normal heart sounds.  Pulmonary/Chest: Effort normal and breath sounds normal.  Neurological: He is alert and oriented to person, place, and time. Gait normal.  Skin: Skin is warm, dry and intact.  Psychiatric: He has a normal mood and affect. His speech is normal and behavior is normal. Judgment and thought content normal. Cognition and memory are normal.  Nursing note and vitals reviewed.   Assessment   1. HTN sl elevated  2. DM >7 3. HLD 4. OSA  5. HM Plan   1. Increase hctz 25 mg qd, losartan 25 mg qd  Check bmet and A1C today  2. Check A1C today cont metformin 500 mg qd  Foot exam at f/u  Referred [name]   3. rec exercise to lose  4. Refer for sleep study  5.  Had flu shot 08/11/18  Tdap utd  Disc shingrix in future  Protected hep B, MMR immune declines std check   PSA nl and DRE normal today colonoscopy sch 06/18/18 tortuous colon f/u in 5 years ? M. Uncle colon cancer  No need for dermatology referral now consider in future  Used to chew chewing tobacco   [facility] saw 2018 wears glasses  Provider: Dr. Olivia Mackie McLean-Scocuzza-Internal Medicine

## 2018-09-14 ENCOUNTER — Encounter: Payer: Self-pay | Admitting: Internal Medicine

## 2018-10-04 ENCOUNTER — Encounter: Payer: Self-pay | Admitting: Internal Medicine

## 2018-10-04 ENCOUNTER — Ambulatory Visit (INDEPENDENT_AMBULATORY_CARE_PROVIDER_SITE_OTHER): Payer: Managed Care, Other (non HMO) | Admitting: Internal Medicine

## 2018-10-04 VITALS — BP 160/98 | HR 101 | Resp 16 | Ht 70.0 in | Wt 329.0 lb

## 2018-10-04 DIAGNOSIS — G4719 Other hypersomnia: Secondary | ICD-10-CM

## 2018-10-04 NOTE — Progress Notes (Signed)
[facility] Consultation      Assessment and Plan:  Excessive daytime sleepiness. - Symptoms and signs of obstructive sleep apnea, will send for sleep study. - Poor sleep hygiene, sleeps in recliner for about 3 hours in the living room, then transfers to bed for another 3 hours.  Discussed that he will need to spend more time in bed.  Essential hypertension, diabetes mellitus, obesity. - Sleep apnea can contribute to above conditions, therefore treatment of sleep apnea is important part of their management.  Weight loss is recommended.  Date: 10/04/2018  MRN# [id number] Jonathan [name] 25-Jul-1966   Jonathan [name] is a 52 y.o. old male seen in consultation for chief complaint of:    Chief Complaint  Patient presents with  . Consult    Referred by Dr. Quentin Oreracy Mclean Scocuzza for eval of sleep eval:  . Snoring    Pt states spouse has witnessed apneic spells along snoring.    HPI:  The patient is a 52 year old male presents with complaints of excessive daytime sleepiness.  Wife is also noted episodes of snoring and witnessed apneas.  He goes to bed between 9 PM and 2 AM, he usually goes to the recliner first in the living room where he watches television and dozes off around 9pm. Wakes around MN, and watches tv for another1-2 hours. Then moves to bed where he goes to bed usually.  He then moves from the recliner to the bed where he watches more television and then dozes off again. He has been doing that for about 2-3 years.  Denies sleep paralysis, no sleep walking or cataplexy.  No TMJ, no jaw pain, no dentures.   PMHX:   Past Medical History:  Diagnosis Date  . Diabetes mellitus without complication (HCC)   . Hyperlipidemia   . Hypertension   . Obesity    Surgical Hx:  Past Surgical History:  Procedure Laterality Date  . COLONOSCOPY WITH PROPOFOL N/A 06/18/2018   Procedure: COLONOSCOPY WITH PROPOFOL;  Surgeon: Pasty Spillersahiliani, Varnita B, MD;  Location: [facility]  ENDOSCOPY;  Service: Endoscopy;  Laterality: N/A;  . TONSILLECTOMY     age 89 or 4   . WISDOM TOOTH EXTRACTION     Family Hx:  Family History  Problem Relation Age of Onset  . Cancer Mother        breast  . CAD Father   . Emphysema Father   . Heart disease Father        CABG   Social Hx:   Social History   Tobacco Use  . Smoking status: Never Smoker  . Smokeless tobacco: Former Engineer, waterUser  Substance Use Topics  . Alcohol use: Yes  . Drug use: No   Medication:    Current Outpatient Medications:  .  Cholecalciferol (D3-1000 PO), Take 2,000 Units by mouth., Disp: , Rfl:  .  hydrochlorothiazide (HYDRODIURIL) 25 MG tablet, Take 1 tablet (25 mg total) by mouth daily. In am, Disp: 90 tablet, Rfl: 3 .  losartan (COZAAR) 25 MG tablet, Take 1 tablet (25 mg total) by mouth daily. In am, Disp: 90 tablet, Rfl: 3 .  metFORMIN (GLUCOPHAGE) 500 MG tablet, Take 1 tablet (500 mg total) by mouth 2 (two) times daily with a meal., Disp: 90 tablet, Rfl: 3 .  triamcinolone cream (KENALOG) 0.1 %, Apply 1 application topically 2 (two) times daily. Left leg, Disp: 45 g, Rfl: 0   Allergies:  Patient has no known allergies.  Review of Systems: Gen:  Denies  fever, sweats, chills HEENT: Denies blurred vision, double vision. bleeds, sore throat Cvc:  No dizziness, chest pain. Resp:   Denies cough or sputum production, shortness of breath Gi: Denies swallowing difficulty, stomach pain. Gu:  Denies bladder incontinence, burning urine Ext:   No Joint pain, stiffness. Skin: No skin rash,  hives  Endoc:  No polyuria, polydipsia. Psych: No depression, insomnia. Other:  All other systems were reviewed with the patient and were negative other that what is mentioned in the HPI.   Physical Examination:   VS: BP (!) 160/98 (BP Location: Left Arm, Cuff Size: Large)   Pulse (!) 101   Resp 16   Ht 5\' 10"  (1.778 m)   Wt (!) 329 lb (149.2 kg)   SpO2 98%   BMI 47.21 kg/m   General Appearance: No distress    Neuro:without focal findings,  speech normal,  HEENT: PERRLA, EOM intact.   Pulmonary: normal breath sounds, No wheezing.  CardiovascularNormal S1,S2.  No m/r/g.   Abdomen: Benign, Soft, non-tender. Renal:  No costovertebral tenderness  GU:  No performed at this time. Endoc: No evident thyromegaly, no signs of acromegaly. Skin:   warm, no rashes, no ecchymosis  Extremities: normal, no cyanosis, clubbing.  Other findings:    LABORATORY PANEL:   CBC No results for input(s): WBC, HGB, HCT, PLT in the last 168 hours. ------------------------------------------------------------------------------------------------------------------  Chemistries  No results for input(s): NA, K, CL, CO2, GLUCOSE, BUN, CREATININE, CALCIUM, MG, AST, ALT, ALKPHOS, BILITOT in the last 168 hours.  Invalid input(s): GFRCGP ------------------------------------------------------------------------------------------------------------------  Cardiac Enzymes No results for input(s): TROPONINI in the last 168 hours. ------------------------------------------------------------  RADIOLOGY:  No results found.     Thank  you for the consultation and for allowing [facility] to assist in the care of your patient. Our recommendations are noted above.  Please contact us if we can be of further service.   Wells Guiles, M.D., F.C.C.P.  Board Certified in Internal Medicine, Pulmonary Medicine, Critical Care Medicine, and Sleep Medicine.  [place] [facility] Office Number: [phone]   10/04/2018

## 2018-10-04 NOTE — Patient Instructions (Addendum)
Will send for sleep study.  Remember to try to sleep the entire night in bed.    Sleep Apnea    Sleep apnea is disorder that affects a person's sleep. A person with sleep apnea has abnormal pauses in their breathing when they sleep. It is hard for them to get a good sleep. This makes a person tired during the day. It also can lead to other physical problems. There are three types of sleep apnea. One type is when breathing stops for a short time because your airway is blocked (obstructive sleep apnea). Another type is when the brain sometimes fails to give the normal signal to breathe to the muscles that control your breathing (central sleep apnea). The third type is a combination of the other two types.  HOME CARE   Take all medicine as told by your doctor.  Avoid alcohol, calming medicines (sedatives), and depressant drugs.  Try to lose weight if you are overweight. Talk to your doctor about a healthy weight goal.  Your doctor may have you use a device that helps to open your airway. It can help you get the air that you need. It is called a positive airway pressure (PAP) device.   MAKE SURE YOU:   Understand these instructions.  Will watch your condition.  Will get help right away if you are not doing well or get worse.  It may take approximately 1 month for you to get used to wearing her CPAP every night.  Be sure to work with your machine to get used to it, be patient, it may take time!  If you have trouble tolerating CPAP DO NOT RETURN YOUR MACHINE; Contact our office to see if we can help you tolerate the CPAP better first!

## 2018-10-26 DIAGNOSIS — G4733 Obstructive sleep apnea (adult) (pediatric): Secondary | ICD-10-CM

## 2018-10-28 DIAGNOSIS — G4733 Obstructive sleep apnea (adult) (pediatric): Secondary | ICD-10-CM | POA: Diagnosis not present

## 2018-10-29 ENCOUNTER — Telehealth: Payer: Self-pay

## 2018-10-29 DIAGNOSIS — G4719 Other hypersomnia: Secondary | ICD-10-CM

## 2018-10-29 DIAGNOSIS — G4733 Obstructive sleep apnea (adult) (pediatric): Secondary | ICD-10-CM

## 2018-10-29 NOTE — Telephone Encounter (Signed)
Attempted to call patient with sleep study results. No answer at number.  Severe OSA with AHI of 64.  Given severity of OSA and elevated BMI, recommend in-lab CPAP titration study. If not covered, consider auto-CPAP with pressure range of 8-20 cm H2O.

## 2018-11-01 NOTE — Addendum Note (Signed)
Addended by: Erlinda Hong on: 11/01/2018 12:54 PM   Modules accepted: Orders

## 2018-11-01 NOTE — Telephone Encounter (Signed)
Spoke to patient regarding sleep study. Orders entered for CPAP titration. Patient aware.

## 2018-11-19 LAB — HM DIABETES EYE EXAM

## 2018-12-23 ENCOUNTER — Ambulatory Visit: Payer: Managed Care, Other (non HMO) | Attending: Internal Medicine

## 2018-12-23 DIAGNOSIS — G4733 Obstructive sleep apnea (adult) (pediatric): Secondary | ICD-10-CM | POA: Insufficient documentation

## 2018-12-28 DIAGNOSIS — G4733 Obstructive sleep apnea (adult) (pediatric): Secondary | ICD-10-CM

## 2018-12-30 ENCOUNTER — Telehealth: Payer: Self-pay | Admitting: Internal Medicine

## 2018-12-30 DIAGNOSIS — G4733 Obstructive sleep apnea (adult) (pediatric): Secondary | ICD-10-CM

## 2018-12-30 NOTE — Telephone Encounter (Signed)
cpap titration performed on 12/23/2018 recommends auto pap 8-20cm h2O.  Pt is aware of results and wished to proceed with cpap.  Order has been placed.  Pt has been scheduled for ROV on 02/18/19 at 9:00. Nothing further is needed.

## 2019-02-18 ENCOUNTER — Ambulatory Visit (INDEPENDENT_AMBULATORY_CARE_PROVIDER_SITE_OTHER): Payer: Managed Care, Other (non HMO) | Admitting: Internal Medicine

## 2019-02-18 DIAGNOSIS — G4733 Obstructive sleep apnea (adult) (pediatric): Secondary | ICD-10-CM

## 2019-02-18 NOTE — Patient Instructions (Signed)
Try to spend as much of your sleeping time in bed with CPAP.  Goal with CPAP is to wear the entire time that you are sleeping.

## 2019-02-18 NOTE — Progress Notes (Addendum)
[facility]  Pulmonary Medicine Consultation     Virtual Visit via Telephone Note I connected with patient on 02/18/19 at  9:00 AM EDT by telephone and verified that I am speaking with the correct person using two identifiers.   I discussed the limitations, risks, security and privacy concerns of performing an evaluation and management service by telephone and the availability of in person appointments. I also discussed with the patient that there may be a patient responsible charge related to this service. The patient expressed understanding and agreed to proceed. I discussed the assessment and treatment plan with the patient. The patient was provided an opportunity to ask questions and all were answered. The patient agreed with the plan and demonstrated an understanding of the instructions. Please see note below for further detail.    The patient was advised to call back or seek an in-person evaluation if the symptoms worsen or if the condition fails to improve as anticipated.  I provided 12 minutes of non-face-to-face time during this encounter.   Shane Crutch, MD    Assessment and Plan:  Severe obstructive sleep apnea. - Severe obstructive sleep apnea with AHI of 64. - Poor sleep hygiene, continues to in recliner for about 2 hours in the living room, then transfers to bed for another 4 hours wearing CPAP.  Reiterated that he will need to spend more time in bed.  Essential hypertension, diabetes mellitus, obesity. - Sleep apnea can contribute to above conditions, therefore treatment of sleep apnea is important part of their management.  Weight loss is recommended.  Return in about 1 year (around 02/18/2020).   Date: 02/18/2019  MRN# [id number] KARNELL ARONHALT April 18, 1966   Jonathan [name] is a 53 y.o. old male seen in consultation for chief complaint of: sleep apnea.      HPI:  The patient is a 53 year old male presents with complaints of excessive daytime sleepiness.   Patient was found to have obstructive sleep apnea. He has been using cpap every night and feels that he is more awake during the day, he is no long snoring. He sleeps about 2 hours on the couch then moves to the bed. He is cleaning supplies weekly.   **CPAP download 01/17/2019-02/15/2019>> usage greater than 4 hours is 22/30 days.  Average usage on days used is 4 hours 24 minutes.  Mode is 8-20.  Median pressure 10, 95th percentile pressure 12, maximum pressure 13.  Residual AHI is 1.  Overall this shows good compliance with excellent control of obstructive sleep apnea. **CPAP titration study 12/27/2018>> recommended CPAP 8-20. **HST 10/24/2018>> severe OSA with AHI of 64.  Wife is also noted episodes of snoring and witnessed apneas.  He goes to bed between 9 PM and 2 AM, he usually goes to the recliner first in the living room where he watches television and dozes off around 9pm. Wakes around MN, and watches tv for another1-2 hours. Then moves to bed where he goes to bed usually.  He then moves from the recliner to the bed where he watches more television and then dozes off again. He has been doing that for about 2-3 years.  Denies sleep paralysis, no sleep walking or cataplexy.  No TMJ, no jaw pain, no dentures.  Medication:    Current Outpatient Medications:  .  Cholecalciferol (D3-1000 PO), Take 2,000 Units by mouth., Disp: , Rfl:  .  hydrochlorothiazide (HYDRODIURIL) 25 MG tablet, Take 1 tablet (25 mg total) by mouth daily. In am, Disp: 90 tablet,  Rfl: 3 .  losartan (COZAAR) 25 MG tablet, Take 1 tablet (25 mg total) by mouth daily. In am, Disp: 90 tablet, Rfl: 3 .  metFORMIN (GLUCOPHAGE) 500 MG tablet, Take 1 tablet (500 mg total) by mouth 2 (two) times daily with a meal., Disp: 90 tablet, Rfl: 3 .  triamcinolone cream (KENALOG) 0.1 %, Apply 1 application topically 2 (two) times daily. Left leg, Disp: 45 g, Rfl: 0   Allergies:  Patient has no known allergies.  Review of Systems:   Constitutional: Feels well. Cardiovascular: Denies chest pain, exertional chest pain.  Pulmonary: Denies hemoptysis, pleuritic chest pain.   The remainder of systems were reviewed and were found to be negative other than what is documented in the HPI.      Physical Examination:    LABORATORY PANEL:   CBC No results for input(s): WBC, HGB, HCT, PLT in the last 168 hours. ------------------------------------------------------------------------------------------------------------------  Chemistries  No results for input(s): NA, K, CL, CO2, GLUCOSE, BUN, CREATININE, CALCIUM, MG, AST, ALT, ALKPHOS, BILITOT in the last 168 hours.  Invalid input(s): GFRCGP ------------------------------------------------------------------------------------------------------------------  Cardiac Enzymes No results for input(s): TROPONINI in the last 168 hours. ------------------------------------------------------------  RADIOLOGY:  No results found.     Thank  you for the consultation and for allowing [facility] to assist in the care of your patient. Our recommendations are noted above.  Please contact us if we can be of further service.   Wells Guileseep Malaiyah Achorn, M.D., F.C.C.P.  Board Certified in Internal Medicine, Pulmonary Medicine, Critical Care Medicine, and Sleep Medicine.  [place] [facility] Office Number: [phone]   02/18/2019

## 2019-02-27 ENCOUNTER — Telehealth: Payer: Managed Care, Other (non HMO) | Admitting: Family

## 2019-02-27 ENCOUNTER — Ambulatory Visit
Admission: EM | Admit: 2019-02-27 | Discharge: 2019-02-27 | Disposition: A | Discharge status: Home / Self Care | Payer: Managed Care, Other (non HMO) | Attending: Urgent Care | Admitting: Urgent Care

## 2019-02-27 ENCOUNTER — Ambulatory Visit (INDEPENDENT_AMBULATORY_CARE_PROVIDER_SITE_OTHER): Payer: Managed Care, Other (non HMO)

## 2019-02-27 DIAGNOSIS — R112 Nausea with vomiting, unspecified: Secondary | ICD-10-CM | POA: Diagnosis not present

## 2019-02-27 DIAGNOSIS — N200 Calculus of kidney: Secondary | ICD-10-CM | POA: Diagnosis not present

## 2019-02-27 MED ORDER — OXYCODONE-ACETAMINOPHEN 5-325 MG PO TABS: 1.0000 | ORAL_TABLET | Freq: Three times a day (TID) | ORAL | 0 refills | Status: DC | PRN

## 2019-02-27 MED ORDER — TAMSULOSIN HCL 0.4 MG PO CAPS: 0.4000 mg | ORAL_CAPSULE | Freq: Every day | ORAL | 0 refills | Status: DC

## 2019-02-27 MED ORDER — ONDANSETRON HCL 4 MG PO TABS: 4.0000 mg | ORAL_TABLET | Freq: Three times a day (TID) | ORAL | 0 refills | Status: DC | PRN

## 2019-02-27 NOTE — ED Triage Notes (Signed)
Pt here for back pain last night and did have some diarrhea yesterday. Now feeling nauseous and vomiting (but nothing came up) Complaining of flank pain on the right side that radiates around his right side and to his abdomen.

## 2019-02-27 NOTE — Progress Notes (Signed)
Greater than 5 minutes, yet less than 10 minutes of time have been spent researching, coordinating, and implementing care for this patient today.  Thank you for the details you included in the comment boxes. Those details are very helpful in determining the best course of treatment for you and help [place] to provide the best care.  If you develop fever/chills, please be seen face-to-face. For now, we can treat  You with standard anti-nausea medications below.  We are sorry that you are not feeling well. Here is how we plan to help!  Based on what you have shared with me it looks like you have a Virus that is irritating your GI tract.  Vomiting is the forceful emptying of a portion of the stomach's content through the mouth.  Although nausea and vomiting can make you feel miserable, it's important to remember that these are not diseases, but rather symptoms of an underlying illness.  When we treat short term symptoms, we always caution that any symptoms that persist should be fully evaluated in a medical office.  I have prescribed a medication that will help alleviate your symptoms and allow you to stay hydrated:  Zofran 4 mg 1 tablet every 8 hours as needed for nausea and vomiting  HOME CARE:  Drink clear liquids.  This is very important! Dehydration (the lack of fluid) can lead to a serious complication.  Start off with 1 tablespoon every 5 minutes for 8 hours.  You may begin eating bland foods after 8 hours without vomiting.  Start with saltine crackers, white bread, rice, mashed potatoes, applesauce.  After 48 hours on a bland diet, you may resume a normal diet.  Try to go to sleep.  Sleep often empties the stomach and relieves the need to vomit.  GET HELP RIGHT AWAY IF:   Your symptoms do not improve or worsen within 2 days after treatment.  You have a fever for over 3 days.  You cannot keep down fluids after trying the medication.  MAKE SURE YOU:   Understand these  instructions.  Will watch your condition.  Will get help right away if you are not doing well or get worse.   Thank you for choosing an e-visit. Your e-visit answers were reviewed by a board certified advanced clinical practitioner to complete your personal care plan. Depending upon the condition, your plan could have included both over the counter or prescription medications. Please review your pharmacy choice. Be sure that the pharmacy you have chosen is open so that you can pick up your prescription now.  If there is a problem you may message your provider in MyChart to have the prescription routed to another pharmacy. Your safety is important to [place]. If you have drug allergies check your prescription carefully.  For the next 24 hours, you can use MyChart to ask questions about today's visit, request a non-urgent call back, or ask for a work or school excuse from your e-visit provider. You will get an e-mail in the next two days asking about your experience. I hope that your e-visit has been valuable and will speed your recovery.

## 2019-02-27 NOTE — ED Provider Notes (Signed)
[address] [place], [place] [address] [phone]   Name: Jonathan [name] DOB: [birth year] MRN: [id number] CSN: [id number] PCP: McLean-Scocuzza, Pasty Spillers, MD  Arrival date and time:  02/27/19 1505  Chief Complaint:  Back Pain  NOTE: Prior to seeing the patient today, I have reviewed the triage nursing documentation and vital signs. Clinical staff has updated patient's PMH/PSHx, current medication list, and drug allergies/intolerances to ensure comprehensive history available to assist in medical decision making.   History:   HPI: Jonathan [name] is a 53 y.o. male who presents today with complaints of acute onset RIGHT lower back pain that began on 02/26/2019.  Patient notes that pain radiates into his right flank and groin.  He has associated nausea and vomiting.  Patient had some minor diarrhea last night that has resolved.  Patient denies any urinary symptoms or PMH significant for urolithiasis.  Patient presents to the clinic tachycardic and hypertensive.  He is visibly uncomfortable and rates his pain at a 7/10.  Of note, patient did a telehealth visit today prior to arrival and was diagnosed with gastrointestinal virus.  Patient was prescribed ondansetron and advised to treat symptomatically at home.  Past Medical History:  Diagnosis Date  . Diabetes mellitus without complication (HCC)   . Hyperlipidemia   . Hypertension   . Obesity     Past Surgical History:  Procedure Laterality Date  . COLONOSCOPY WITH PROPOFOL N/A 06/18/2018   Procedure: COLONOSCOPY WITH PROPOFOL;  Surgeon: Pasty Spillers, MD;  Location: [facility] ENDOSCOPY;  Service: Endoscopy;  Laterality: N/A;  . TONSILLECTOMY     age 87 or 4   . WISDOM TOOTH EXTRACTION      Family History  Problem Relation Age of Onset  . Cancer Mother        breast  . CAD Father   . Emphysema Father   . Heart disease Father        CABG    Social History   Socioeconomic History  . Marital status: Married     Spouse name: Not on file  . Number of children: Not on file  . Years of education: Not on file  . Highest education level: Not on file  Occupational History  . Not on file  Social Needs  . Financial resource strain: Not on file  . Food insecurity:    Worry: Not on file    Inability: Not on file  . Transportation needs:    Medical: Not on file    Non-medical: Not on file  Tobacco Use  . Smoking status: Never Smoker  . Smokeless tobacco: Former Engineer, water and Sexual Activity  . Alcohol use: Yes  . Drug use: No  . Sexual activity: Yes  Lifestyle  . Physical activity:    Days per week: Not on file    Minutes per session: Not on file  . Stress: Not on file  Relationships  . Social connections:    Talks on phone: Not on file    Gets together: Not on file    Attends religious service: Not on file    Active member of club or organization: Not on file    Attends meetings of clubs or organizations: Not on file    Relationship status: Not on file  . Intimate partner violence:    Fear of current or ex partner: Not on file    Emotionally abused: Not on file    Physically abused: Not on file  Forced sexual activity: Not on file  Other Topics Concern  . 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    Former chewing tobacco   Owns guns, wears seat belts, safe in relationship     Patient Active Problem List   Diagnosis Date Noted  . HLD (hyperlipidemia) 09/10/2018  . Snoring 09/10/2018  . Encounter for screening colonoscopy   . Tortuous colon   . DM2 (diabetes mellitus, type 2) (HCC) 05/14/2018  . Vitamin D deficiency 05/14/2018  . Essential hypertension 04/21/2018  . Class 3 severe obesity due to excess calories with serious comorbidity and body mass index (BMI) of 45.0 to 49.9 in adult (HCC) 04/21/2018  . Venous stasis dermatitis of left lower extremity 04/21/2018  .  Umbilical hernia without obstruction and without gangrene 04/21/2018    Home Medications:    Current Meds  Medication Sig  . hydrochlorothiazide (HYDRODIURIL) 25 MG tablet Take 1 tablet (25 mg total) by mouth daily. In am  . losartan (COZAAR) 25 MG tablet Take 1 tablet (25 mg total) by mouth daily. In am  . metFORMIN (GLUCOPHAGE) 500 MG tablet Take 1 tablet (500 mg total) by mouth 2 (two) times daily with a meal.  . ondansetron (ZOFRAN) 4 MG tablet Take 1 tablet (4 mg total) by mouth every 8 (eight) hours as needed for nausea or vomiting.    Allergies:   Patient has no known allergies.  Review of Systems (ROS): Review of Systems  Constitutional: Negative for chills and fever.  Respiratory: Negative for cough and shortness of breath.   Cardiovascular: Negative for chest pain and palpitations.  Gastrointestinal: Positive for diarrhea (resolved), nausea and vomiting.  Genitourinary: Positive for flank pain and testicular pain. Negative for decreased urine volume, difficulty urinating, dysuria, frequency, hematuria and urgency.  Musculoskeletal: Positive for back pain.  Neurological: Negative for dizziness, weakness and headaches.  Psychiatric/Behavioral: The patient is nervous/anxious.      Physical Exam:  Triage Vital Signs ED Triage Vitals  Enc Vitals Group     BP 02/27/19 1518 (!) 161/106     Pulse Rate 02/27/19 1518 (!) 101     Resp 02/27/19 1518 18     Temp 02/27/19 1518 98.2 F (36.8 C)     Temp Source 02/27/19 1518 Oral     SpO2 02/27/19 1518 99 %     Weight 02/27/19 1521 (!) 330 lb (149.7 kg)     Height 02/27/19 1521 5\' 10"  (1.778 m)     Head Circumference --      Peak Flow --      Pain Score 02/27/19 1520 7     Pain Loc --      Pain Edu? --      Excl. in GC? --     Physical Exam  Constitutional: He is oriented to person, place, and time and well-developed, well-nourished, and in no distress.  HENT:  Head: Normocephalic and atraumatic.  Mouth/Throat:  Oropharynx is clear and moist and mucous membranes are normal.  Cardiovascular: Normal rate, regular rhythm, normal heart sounds and intact distal pulses. Exam reveals no gallop and no friction rub.  No murmur heard. Pulmonary/Chest: Effort normal and breath sounds normal. No respiratory distress. He has no wheezes. He has no rales.  Abdominal: Soft. Normal appearance. He exhibits no distension. There is no hepatosplenomegaly.  There is abdominal tenderness in the right lower quadrant. There is CVA tenderness (RIGHT). There is no rebound, no tenderness at McBurney's point and negative Murphy's sign.  Neurological: He is alert and oriented to person, place, and time.  Skin: Skin is warm and dry. No rash noted. No erythema.  Psychiatric: Affect and judgment normal. His mood appears anxious.  Nursing note and vitals reviewed.    Urgent Care Treatments / Results:   LABS: PLEASE NOTE: all labs that were ordered this encounter are listed, however only abnormal results are displayed. Labs Reviewed  URINALYSIS, COMPLETE (UACMP) WITH MICROSCOPIC - Abnormal; Notable for the following components:      Result Value   Specific Gravity, Urine >1.030 (*)    Glucose, UA 250 (*)    Hgb urine dipstick MODERATE (*)    Protein, ur TRACE (*)    All other components within normal limits    EKG: No orders found for this or any previous visit.  RADIOLOGY: Ct Renal Stone Study  Result Date: 02/27/2019 CLINICAL DATA:  53 year old male with acute RIGHT abdominal and flank pain with nausea. EXAM: CT ABDOMEN AND PELVIS WITHOUT CONTRAST TECHNIQUE: Multidetector CT imaging of the abdomen and pelvis was performed following the standard protocol without IV contrast. COMPARISON:  None. FINDINGS: Please note that parenchymal abnormalities may be missed without intravenous contrast. Lower chest: No acute abnormalities. Hepatobiliary: Hepatic steatosis identified without focal hepatic lesions. The gallbladder is  unremarkable. No biliary dilatation. Pancreas: Unremarkable Spleen: Unremarkable Adrenals/Urinary Tract: A 4 mm proximal RIGHT ureteral calculus causes moderate RIGHT hydronephrosis and perinephric inflammation. A 2 mm calculus and a 4 mm calculus within the RIGHT kidney are nonobstructing. The LEFT kidney, adrenal glands and bladder are unremarkable. Stomach/Bowel: Stomach is within normal limits. Appendix appears normal. No evidence of bowel wall thickening, distention, or inflammatory changes. Vascular/Lymphatic: No significant vascular findings are present. No enlarged abdominal or pelvic lymph nodes. Reproductive: Prostate calcifications noted. Other: No ascites, focal collection or pneumoperitoneum. A small umbilical hernia containing fat is noted. Musculoskeletal: No acute or suspicious bony abnormalities identified. Mild degenerative changes in the lumbar spine noted. IMPRESSION: 1. 4 mm proximal RIGHT ureteral calculus causing moderate RIGHT hydronephrosis. 2. RIGHT nephrolithiasis 3. Hepatic steatosis Electronically Signed   By: Harmon Pier M.D.   On: 02/27/2019 16:17    PRODEDURES: Procedures  MEDICATIONS RECEIVED THIS VISIT: Medications - No data to display  PERTINENT CLINICAL COURSE NOTES/UPDATES: No data to display   Initial Impression / Assessment and Plan / Urgent Care Course:    Jonathan [name] is a 53 y.o. male who presents to [facility] today with complaints of Back Pain  Pertinent labs & imaging results that were available during my care of the patient were personally reviewed by me and considered in my medical decision making (see lab/imaging section of note for values and interpretations).  Patient presents to clinic today in obvious discomfort.  Pain self rated 7/10.  He was noted to be mildly tachycardic and hypertensive upon arrival.  UA positive for microscopic hematuria with uric acid crystals.  CT stone study revealed a total of 3 urolithiasis.  There was a 2  and 4 mm calculus in the right kidney that were nonobstructing, in addition to a 4 mm proximal RIGHT ureteral calculus causing moderate hydronephrosis.  Given the size of the ureteral stone, patient should be able to pass this at home.  Will send prescription for tamsulosin 0.4 mg daily and encourage patient to increase  fluid intake in efforts to promote calculus passage.  Will give patient a short term prescription for Percocet to use on a PRN basis.  Patient has ondansetron that was prescribed earlier today by telemedicine.  He was encouraged to use this as needed for nausea.  Patient provided with a urine strainer and educated on need to strain all urine and attempt to collect stone.  Discussed possibility of stone analysis and its role in developing a plan to prevent future calculus development.  Patient provided with name and contact information for urologist Richardo Hanks, MD).  He was asked to contact office in the morning to discuss plans for follow-up evaluation.  I have reviewed the follow up and strict return precautions for any new or worsening symptoms. Patient is aware of symptoms that would be deemed urgent/emergent, and would thus require further evaluation either here or in the emergency department. At the time of discharge, he verbalized understanding and consent with the discharge plan as it was reviewed with him. All questions were fielded by provider and/or clinic staff prior to patient discharge.    Final Clinical Impressions(s) / Urgent Care Diagnoses:   Final diagnoses:  Kidney stones    New Prescriptions:   Meds ordered this encounter  Medications  . tamsulosin (FLOMAX) 0.4 MG CAPS capsule    Sig: Take 1 capsule (0.4 mg total) by mouth daily.    Dispense:  14 capsule    Refill:  0  . oxyCODONE-acetaminophen (PERCOCET) 5-325 MG tablet    Sig: Take 1 tablet by mouth every 8 (eight) hours as needed for severe pain.    Dispense:  15 tablet    Refill:  0    Controlled Substance  Prescriptions:  [place] Controlled Substance Registry consulted? Yes - I have consulted the [place] Controlled Substances Registry for this patient, and feel the risk/benefit ratio today is favorable for proceeding with this prescription for a controlled substance.  NOTE: This note was prepared using Scientist, clinical (histocompatibility and immunogenetics) along with smaller Lobbyist. Despite my best ability to proofread, there is the potential that transcriptional errors may still occur from this process, and are completely unintentional.     Verlee Monte, NP 02/27/19 [phone]

## 2019-02-27 NOTE — Discharge Instructions (Addendum)
It was very nice meeting you today in clinic. Thank you for entrusting me with your care.   As discussed, you have THREE kidney stones. One is causing you RIGHT kidney to swell. Will treat as follows: Strain ALL of your urine. If you pass a stone, take it with you to urologist and they can send off for testing that will allow them to tell you how to prevent future stones.  Please utilize the medications that we discussed. Your prescriptions have been called in to your pharmacy.  Increase fluid intake as much as possible to promote passage.  Make arrangements to follow up with urology this week. I have given your the name on a local urologist. Please call IN THE MORNING for an appointment. If your symptoms/condition worsens, please seek follow up care either here or in the ER. Please remember, our [facility] providers are "right here with you" when you need [place].   Again, it was my pleasure to take care of you today. Thank you for choosing our clinic. I hope that you start to feel better quickly.   Quentin Mulling, MSN, APRN, FNP-C, CEN Advanced Practice Provider [facility]

## 2019-02-28 ENCOUNTER — Telehealth: Payer: Self-pay | Admitting: *Deleted

## 2019-02-28 LAB — URINALYSIS, COMPLETE (UACMP) WITH MICROSCOPIC
Bacteria, UA: NONE SEEN
Bilirubin Urine: NEGATIVE
Glucose, UA: 250 mg/dL — AB
Ketones, ur: NEGATIVE mg/dL
Leukocytes,Ua: NEGATIVE
Nitrite: NEGATIVE
Specific Gravity, Urine: >1.03 — ABNORMAL HIGH (ref 1.005–1.030)
pH: 5.5 (ref 5.0–8.0)

## 2019-02-28 NOTE — Telephone Encounter (Signed)
Copied from CRM [id number]. Topic: Referral - Request for Referral >> Feb 28, 2019  8:55 AM Dalphine Handing A wrote: Has patient seen PCP for this complaint? No *If NO, is insurance requiring patient see PCP for this issue before PCP can refer them? Referral for which specialty: Urologist Preferred provider/office: No preference Reason for referral: Patient was seen in emergency room yesterday for kidney stones and was told he cannot see a urologist until his has a referral from PCP.

## 2019-03-01 ENCOUNTER — Other Ambulatory Visit: Payer: Self-pay | Admitting: Internal Medicine

## 2019-03-01 ENCOUNTER — Encounter: Payer: Self-pay | Admitting: Internal Medicine

## 2019-03-01 ENCOUNTER — Telehealth: Payer: Self-pay | Admitting: Urology

## 2019-03-01 DIAGNOSIS — N200 Calculus of kidney: Secondary | ICD-10-CM

## 2019-03-01 NOTE — Telephone Encounter (Signed)
Pt needs urgent urology referral Dr. Virl Diamond  Please sch urgent urology referral for kidney stones

## 2019-03-01 NOTE — Telephone Encounter (Signed)
Urgent Urology appointment scheduled with Dr Lonna Cobb at [facility] on 03/08/19 at 11:00am, patient has been notified.

## 2019-03-08 ENCOUNTER — Encounter: Payer: Self-pay | Admitting: Urology

## 2019-03-08 ENCOUNTER — Other Ambulatory Visit: Payer: Self-pay

## 2019-03-08 ENCOUNTER — Ambulatory Visit (INDEPENDENT_AMBULATORY_CARE_PROVIDER_SITE_OTHER): Payer: Managed Care, Other (non HMO) | Admitting: Urology

## 2019-03-08 VITALS — BP 144/89 | HR 100 | Ht 70.0 in | Wt 328.0 lb

## 2019-03-08 DIAGNOSIS — N200 Calculus of kidney: Secondary | ICD-10-CM | POA: Diagnosis not present

## 2019-03-08 LAB — URINALYSIS, COMPLETE
Bilirubin, UA: NEGATIVE
Glucose, UA: NEGATIVE
Ketones, UA: NEGATIVE
Leukocytes,UA: NEGATIVE
Nitrite, UA: NEGATIVE
Protein,UA: NEGATIVE
Specific Gravity, UA: 1.01 (ref 1.005–1.030)
Urobilinogen, Ur: 0.2 mg/dL (ref 0.2–1.0)
pH, UA: 5 (ref 5.0–7.5)

## 2019-03-08 LAB — MICROSCOPIC EXAMINATION
Bacteria, UA: NONE SEEN
Epithelial Cells (non renal): NONE SEEN /hpf (ref 0–10)
WBC, UA: NONE SEEN /hpf (ref 0–5)

## 2019-03-08 NOTE — Patient Instructions (Signed)
[facility] Instructions [facility]  You will receive a box/kit in the mail that will have a urine jug and instructions in the kit.  When the box arrives you will need to call our office [phone] to schedule a LAB appointment.  You will need to do a 24hour urine and this should be done during the days that our office will be open.  For example any day from Sunday through Thursday.  How to collect the urine sample: On the day you start the urine sample this 1st morning urine should NOT be collected.  For the rest of the day including all night urines should be collected.  On the next morning the 1st urine should be collected and then you will be finished with the urine collections.  You will need to bring the box with you on your LAB appointment day after urine has been collected and all instructions are complete in the box.  Your blood will be drawn and the box will be collected by our Lab employee to be sent off for analysis.  When urine and blood is complete you will need to schedule a follow up appointment for lab results.

## 2019-03-08 NOTE — Progress Notes (Signed)
03/08/2019 5:13 PM   Jonathan [name] [birth year] [id number]  Referring provider: McLean-Scocuzza, Pasty Spillers, MD [address] [place], [place] [address]  Chief Complaint  Patient presents with  . Nephrolithiasis    New Patient    HPI: Jonathan [name] is a 53 year old male who presented to [facility] on 02/27/2019 with acute onset of right flank pain that started 1 day prior.  There were no identifiable precipitating, aggravating or alleviating factors.  Severity was rated 7/10 and the pain radiated to the right groin region.  He had nausea and vomiting but denied fever or chills.  As part of his evaluation a stone protocol CT of the abdomen and pelvis was performed which showed a 4 mm right proximal ureteral calculus with moderate hydronephrosis and hydroureter.  There were nonobstructing 2 mm and 4 mm right renal calculi present.  He was discharged on tamsulosin and Percocet.  He states he had intermittent pain for 72 hours then noted significant improvement.  He passed the stone on 5/15 and brings it in today.  He denies prior history of stone disease or other urologic problems.  His symptoms have completely resolved since passing the stone.   PMH: Past Medical History:  Diagnosis Date  . Diabetes mellitus without complication (HCC)   . Hyperlipidemia   . Hypertension   . Obesity     Surgical History: Past Surgical History:  Procedure Laterality Date  . COLONOSCOPY WITH PROPOFOL N/A 06/18/2018   Procedure: COLONOSCOPY WITH PROPOFOL;  Surgeon: Pasty Spillers, MD;  Location: [facility] END[facility]COPY;  Service: Endoscopy;  Laterality: N/A;  . TONSILLECTOMY     age 69 or 4   . WISDOM TOOTH EXTRACTION      Home Medications:  Allergies as of 03/08/2019   No Known Allergies     Medication List       Accurate as of Mar 08, 2019  5:13 PM. If you have any questions, ask your nurse or doctor.        D3-1000 PO Take 2,000 Units by mouth.   hydrochlorothiazide 25 MG  tablet Commonly known as:  HYDRODIURIL Take 1 tablet (25 mg total) by mouth daily. In am   losartan 25 MG tablet Commonly known as:  COZAAR Take 1 tablet (25 mg total) by mouth daily. In am   metFORMIN 500 MG tablet Commonly known as:  GLUCOPHAGE Take 1 tablet (500 mg total) by mouth 2 (two) times daily with a meal.   ondansetron 4 MG tablet Commonly known as:  Zofran Take 1 tablet (4 mg total) by mouth every 8 (eight) hours as needed for nausea or vomiting.   oxyCODONE-acetaminophen 5-325 MG tablet Commonly known as:  Percocet Take 1 tablet by mouth every 8 (eight) hours as needed for severe pain.   tamsulosin 0.4 MG Caps capsule Commonly known as:  Flomax Take 1 capsule (0.4 mg total) by mouth daily.   triamcinolone cream 0.1 % Commonly known as:  KENALOG Apply 1 application topically 2 (two) times daily. Left leg       Allergies: No Known Allergies  Family History: Family History  Problem Relation Age of Onset  . Cancer Mother        breast  . CAD Father   . Emphysema Father   . Heart disease Father        CABG    Social History:  reports that he has never smoked. He has quit using smokeless tobacco. He reports current alcohol use. He  reports that he does not use drugs.  ROS: UROLOGY Frequent Urination?: No Hard to postpone urination?: No Burning/pain with urination?: No Get up at night to urinate?: No Leakage of urine?: No Urine stream starts and stops?: No Trouble starting stream?: No Do you have to strain to urinate?: No Blood in urine?: No Urinary tract infection?: No Sexually transmitted disease?: No Injury to kidneys or bladder?: No Painful intercourse?: No Weak stream?: No Erection problems?: No Penile pain?: No  Gastrointestinal Nausea?: No Vomiting?: No Indigestion/heartburn?: No Diarrhea?: No Constipation?: No  Constitutional Fever: No Night sweats?: No Weight loss?: No Fatigue?: No  Skin Skin rash/lesions?: No Itching?: No   Eyes Blurred vision?: No Double vision?: No  Ears/Nose/Throat Sore throat?: No Sinus problems?: No  Hematologic/Lymphatic Swollen glands?: No Easy bruising?: No  Cardiovascular Leg swelling?: No Chest pain?: No  Respiratory Cough?: No Shortness of breath?: No  Endocrine Excessive thirst?: No  Musculoskeletal Back pain?: No Joint pain?: No  Neurological Headaches?: No Dizziness?: No  Psychologic Depression?: No Anxiety?: No  Physical Exam: BP (!) 144/89   Pulse 100   Ht  (1.778 m)   Wt (!) 328 lb (148.8 kg)   BMI 47.06 kg/m   Constitutional:  Alert and oriented, No acute distress. HEENT: [place] AT, moist mucus membranes.  Trachea midline, no masses. Cardiovascular: No clubbing, cyanosis, or edema. Respiratory: Normal respiratory effort, no increased work of breathing. GI: Abdomen is soft, nontender, nondistended, no abdominal masses GU: No CVA tenderness Lymph: No cervical or inguinal lymphadenopathy. Skin: No rashes, bruises or suspicious lesions. Neurologic: Grossly intact, no focal deficits, moving all 4 extremities. Psychiatric: Normal mood and affect.  Laboratory Data: Lab Results  Component Value Date   WBC 6.8 04/26/2018   HGB 16.5 04/26/2018   HCT 47.8 04/26/2018   MCV 87.2 04/26/2018   PLT 267.0 04/26/2018    Lab Results  Component Value Date   CREATININE 1.08 09/10/2018    Lab Results  Component Value Date   PSA 0.71 04/26/2018    Lab Results  Component Value Date   HGBA1C 7.6 (H) 09/10/2018    Urinalysis    Component Value Date/Time   COLORURINE YELLOW 02/27/2019 1513   APPEARANCEUR Clear 03/08/2019 1053   LABSPEC >1.030 (H) 02/27/2019 1513   PHURINE 5.5 02/27/2019 1513   GLUCOSEU Negative 03/08/2019 1053   HGBUR MODERATE (A) 02/27/2019 1513   BILIRUBINUR Negative 03/08/2019 1053   KETONESUR NEGATIVE 02/27/2019 1513   PROTEINUR Negative 03/08/2019 1053   PROTEINUR TRACE (A) 02/27/2019 1513   NITRITE Negative  03/08/2019 1053   NITRITE NEGATIVE 02/27/2019 1513   LEUKOCYTESUR Negative 03/08/2019 1053   LEUKOCYTESUR NEGATIVE 02/27/2019 1513    Lab Results  Component Value Date   LABMICR See below: 03/08/2019   WBCUA None seen 03/08/2019   RBCUA 0-2 04/26/2018   LABEPIT None seen 03/08/2019   MUCUS Present 04/26/2018   BACTERIA None seen 03/08/2019    Pertinent Imaging: CT was personally reviewed  Results for orders placed during the hospital encounter of 02/27/19  CT Renal Stone Study   Narrative CLINICAL DATA:  53 year old male with acute RIGHT abdominal and flank pain with nausea.  EXAM: CT ABDOMEN AND PELVIS WITHOUT CONTRAST  TECHNIQUE: Multidetector CT imaging of the abdomen and pelvis was performed following the standard protocol without IV contrast.  COMPARISON:  None.  FINDINGS: Please note that parenchymal abnormalities may be missed without intravenous contrast.  Lower chest: No acute abnormalities.  Hepatobiliary: Hepatic steatosis identified  without focal hepatic lesions. The gallbladder is unremarkable. No biliary dilatation.  Pancreas: Unremarkable  Spleen: Unremarkable  Adrenals/Urinary Tract: A 4 mm proximal RIGHT ureteral calculus causes moderate RIGHT hydronephrosis and perinephric inflammation. A 2 mm calculus and a 4 mm calculus within the RIGHT kidney are nonobstructing.  The LEFT kidney, adrenal glands and bladder are unremarkable.  Stomach/Bowel: Stomach is within normal limits. Appendix appears normal. No evidence of bowel wall thickening, distention, or inflammatory changes.  Vascular/Lymphatic: No significant vascular findings are present. No enlarged abdominal or pelvic lymph nodes.  Reproductive: Prostate calcifications noted.  Other: No ascites, focal collection or pneumoperitoneum. A small umbilical hernia containing fat is noted.  Musculoskeletal: No acute or suspicious bony abnormalities identified. Mild degenerative changes in  the lumbar spine noted.  IMPRESSION: 1. 4 mm proximal RIGHT ureteral calculus causing moderate RIGHT hydronephrosis. 2. RIGHT nephrolithiasis 3. Hepatic steatosis   Electronically Signed   By: Harmon PierJeffrey  Hu M.D.   On: 02/27/2019 16:17     Assessment & Plan:   53 year old male with a recently passed ureteral calculus.  He has nonobstructing right renal calculi present.  His stone will be sent for analysis.  I recommended proceeding with a metabolic evaluation to include blood work and a 24-hour urine study.  He will be notified with his 24-hour urine results and was instructed to consume his typical diet and fluid intake during the study.  Follow-up visit with KUB in 3 months.  Return in about 3 months (around 06/08/2019) for Recheck, KUB.   Riki AltesScott C Stoioff, MD  [facility] [address], [address] [place], [place] [address] [phone]

## 2019-03-11 ENCOUNTER — Other Ambulatory Visit: Payer: Self-pay | Admitting: Urology

## 2019-03-11 ENCOUNTER — Other Ambulatory Visit: Payer: Self-pay

## 2019-03-11 ENCOUNTER — Ambulatory Visit (INDEPENDENT_AMBULATORY_CARE_PROVIDER_SITE_OTHER): Payer: Managed Care, Other (non HMO) | Admitting: Internal Medicine

## 2019-03-11 ENCOUNTER — Encounter: Payer: Self-pay | Admitting: Internal Medicine

## 2019-03-11 DIAGNOSIS — Z1329 Encounter for screening for other suspected endocrine disorder: Secondary | ICD-10-CM

## 2019-03-11 DIAGNOSIS — E119 Type 2 diabetes mellitus without complications: Secondary | ICD-10-CM | POA: Diagnosis not present

## 2019-03-11 DIAGNOSIS — I10 Essential (primary) hypertension: Secondary | ICD-10-CM

## 2019-03-11 DIAGNOSIS — E559 Vitamin D deficiency, unspecified: Secondary | ICD-10-CM

## 2019-03-11 DIAGNOSIS — N200 Calculus of kidney: Secondary | ICD-10-CM | POA: Diagnosis not present

## 2019-03-11 DIAGNOSIS — Z125 Encounter for screening for malignant neoplasm of prostate: Secondary | ICD-10-CM

## 2019-03-11 MED ORDER — LOSARTAN POTASSIUM 50 MG PO TABS: 50.0000 mg | ORAL_TABLET | Freq: Every day | ORAL | 3 refills | Status: DC

## 2019-03-11 MED ORDER — METFORMIN HCL 500 MG PO TABS: 500.0000 mg | ORAL_TABLET | Freq: Two times a day (BID) | ORAL | 3 refills | Status: DC

## 2019-03-11 NOTE — Patient Instructions (Signed)
Low-Purine Eating Plan A low-purine eating plan involves making food choices to limit your intake of purine. Purine is a kind of uric acid. Too much uric acid in your blood can cause certain conditions, such as gout and kidney stones. Eating a low-purine diet can help control these conditions. What are tips for following this plan? Reading food labels   Avoid foods with saturated or Trans fat.  Check the ingredient list of grains-based foods, such as bread and cereal, to make sure that they contain whole grains.  Check the ingredient list of sauces or soups to make sure they do not contain meat or fish.  When choosing soft drinks, check the ingredient list to make sure they do not contain high-fructose corn syrup. Shopping  Buy plenty of fresh fruits and vegetables.  Avoid buying canned or fresh fish.  Buy dairy products labeled as low-fat or nonfat.  Avoid buying premade or processed foods. These foods are often high in fat, salt (sodium), and added sugar. Cooking  Use olive oil instead of butter when cooking. Oils like olive oil, canola oil, and sunflower oil contain healthy fats. Meal planning  Learn which foods do or do not affect you. If you find out that a food tends to cause your gout symptoms to flare up, avoid eating that food. You can enjoy foods that do not cause problems. If you have any questions about a food item, talk with your dietitian or health care provider.  Limit foods high in fat, especially saturated fat. Fat makes it harder for your body to get rid of uric acid.  Choose foods that are lower in fat and are lean sources of protein. General guidelines  Limit alcohol intake to no more than 1 drink a day for nonpregnant women and 2 drinks a day for men. One drink equals 12 oz of beer, 5 oz of wine, or 1 oz of hard liquor. Alcohol can affect the way your body gets rid of uric acid.  Drink plenty of water to keep your urine clear or pale yellow. Fluids can help  remove uric acid from your body.  If directed by your health care provider, take a vitamin C supplement.  Work with your health care provider and dietitian to develop a plan to achieve or maintain a healthy weight. Losing weight can help reduce uric acid in your blood. What foods are recommended? The items listed may not be a complete list. Talk with your dietitian about what dietary choices are best for you. Foods low in purines Foods low in purines do not need to be limited. These include:  All fruits.  All low-purine vegetables, pickles, and olives.  Breads, pasta, rice, cornbread, and popcorn. Cake and other baked goods.  All dairy foods.  Eggs, nuts, and nut butters.  Spices and condiments, such as salt, herbs, and vinegar.  Plant oils, butter, and margarine.  Water, sugar-free soft drinks, tea, coffee, and cocoa.  Vegetable-based soups, broths, sauces, and gravies. Foods moderate in purines Foods moderate in purines should be limited to the amounts listed.   cup of asparagus, cauliflower, spinach, mushrooms, or green peas, each day.  2/3 cup uncooked oatmeal, each day.   cup dry wheat bran or wheat germ, each day.  2-3 ounces of meat or poultry, each day.  4-6 ounces of shellfish, such as crab, lobster, oysters, or shrimp, each day.  1 cup cooked beans, peas, or lentils, each day.  Soup, broths, or bouillon made from meat or  fish. Limit these foods as much as possible. What foods are not recommended? The items listed may not be a complete list. Talk with your dietitian about what dietary choices are best for you. Limit your intake of foods high in purines, including:  Beer and other alcohol.  Meat-based gravy or sauce.  Canned or fresh fish, such as: ? Anchovies, sardines, herring, and tuna. ? Mussels and scallops. ? Codfish, trout, and haddock.  Tomasa Blase.  Organ meats, such as: ? Liver or kidney. ? Tripe. ? Sweetbreads (thymus gland or  pancreas).  Wild Education officer, environmental.  Yeast or yeast extract supplements.  Drinks sweetened with high-fructose corn syrup. Summary  Eating a low-purine diet can help control conditions caused by too much uric acid in the body, such as gout or kidney stones.  Choose low-purine foods, limit alcohol, and limit foods high in fat.  You will learn over time which foods do or do not affect you. If you find out that a food tends to cause your gout symptoms to flare up, avoid eating that food. This information is not intended to replace advice given to you by your health care provider. Make sure you discuss any questions you have with your health care provider. Document Released: 01/31/2011 Document Revised: 11/19/2016 Document Reviewed: 11/19/2016 Elsevier Interactive Patient Education  2019 [employer] Inc[employer]  Dietary Guidelines to Help Prevent Kidney Stones Kidney stones are deposits of minerals and salts that form inside your kidneys. Your risk of developing kidney stones may be greater depending on your diet, your lifestyle, the medicines you take, and whether you have certain medical conditions. Most people can reduce their chances of developing kidney stones by following the instructions below. Depending on your overall health and the type of kidney stones you tend to develop, your dietitian may give you more specific instructions. What are tips for following this plan? Reading food labels  Choose foods with "no salt added" or "low-salt" labels. Limit your sodium intake to less than 1500 mg per day.  Choose foods with calcium for each meal and snack. Try to eat about 300 mg of calcium at each meal. Foods that contain 200-500 mg of calcium per serving include: ? 8 oz (237 ml) of milk, fortified nondairy milk, and fortified fruit juice. ? 8 oz (237 ml) of kefir, yogurt, and soy yogurt. ? 4 oz (118 ml) of tofu. ? 1 oz of cheese. ? 1 cup (300 g) of dried figs. ? 1 cup (91 g) of cooked  broccoli. ? 1-3 oz can of sardines or mackerel.  Most people need 1000 to 1500 mg of calcium each day. Talk to your dietitian about how much calcium is recommended for you. Shopping  Buy plenty of fresh fruits and vegetables. Most people do not need to avoid fruits and vegetables, even if they contain nutrients that may contribute to kidney stones.  When shopping for convenience foods, choose: ? Whole pieces of fruit. ? Premade salads with dressing on the side. ? Low-fat fruit and yogurt smoothies.  Avoid buying frozen meals or prepared deli foods.  Look for foods with live cultures, such as yogurt and kefir. Cooking  Do not add salt to food when cooking. Place a salt shaker on the table and allow each person to add his or her own salt to taste.  Use vegetable protein, such as beans, textured vegetable protein (TVP), or tofu instead of meat in pasta, casseroles, and soups. Meal planning   Eat less salt, if told  by your dietitian. To do this: ? Avoid eating processed or premade food. ? Avoid eating fast food.  Eat less animal protein, including cheese, meat, poultry, or fish, if told by your dietitian. To do this: ? Limit the number of times you have meat, poultry, fish, or cheese each week. Eat a diet free of meat at least 2 days a week. ? Eat only one serving each day of meat, poultry, fish, or seafood. ? When you prepare animal protein, cut pieces into small portion sizes. For most meat and fish, one serving is about the size of one deck of cards.  Eat at least 5 servings of fresh fruits and vegetables each day. To do this: ? Keep fruits and vegetables on hand for snacks. ? Eat 1 piece of fruit or a handful of berries with breakfast. ? Have a salad and fruit at lunch. ? Have two kinds of vegetables at dinner.  Limit foods that are high in a substance called oxalate. These include: ? Spinach. ? Rhubarb. ? Beets. ? Potato chips and french fries. ? Nuts.  If you regularly  take a diuretic medicine, make sure to eat at least 1-2 fruits or vegetables high in potassium each day. These include: ? Avocado. ? Banana. ? Orange, prune, carrot, or tomato juice. ? Baked potato. ? Cabbage. ? Beans and split peas. General instructions   Drink enough fluid to keep your urine clear or pale yellow. This is the most important thing you can do.  Talk to your health care provider and dietitian about taking daily supplements. Depending on your health and the cause of your kidney stones, you may be advised: ? Not to take supplements with vitamin C. ? To take a calcium supplement. ? To take a daily probiotic supplement. ? To take other supplements such as magnesium, fish oil, or vitamin B6.  Take all medicines and supplements as told by your health care provider.  Limit alcohol intake to no more than 1 drink a day for nonpregnant women and 2 drinks a day for men. One drink equals 12 oz of beer, 5 oz of wine, or 1 oz of hard liquor.  Lose weight if told by your health care provider. Work with your dietitian to find strategies and an eating plan that works best for you. What foods are not recommended? Limit your intake of the following foods, or as told by your dietitian. Talk to your dietitian about specific foods you should avoid based on the type of kidney stones and your overall health. Grains Breads. Bagels. Rolls. Baked goods. Salted crackers. Cereal. Pasta. Vegetables Spinach. Rhubarb. Beets. Canned vegetables. Rosita Fire. Olives. Meats and other protein foods Nuts. Nut butters. Large portions of meat, poultry, or fish. Salted or cured meats. Deli meats. Hot dogs. Sausages. Dairy Cheese. Beverages Regular soft drinks. Regular vegetable juice. Seasonings and other foods Seasoning blends with salt. Salad dressings. Canned soups. Soy sauce. Ketchup. Barbecue sauce. Canned pasta sauce. Casseroles. Pizza. Lasagna. Frozen meals. Potato chips. [place] fries. Summary  You  can reduce your risk of kidney stones by making changes to your diet.  The most important thing you can do is drink enough fluid. You should drink enough fluid to keep your urine clear or pale yellow.  Ask your health care provider or dietitian how much protein from animal sources you should eat each day, and also how much salt and calcium you should have each day. This information is not intended to replace advice given to you  by your health care provider. Make sure you discuss any questions you have with your health care provider. Document Released: 01/31/2011 Document Revised: 09/16/2016 Document Reviewed: 09/16/2016 Elsevier Interactive Patient Education  2019 Else[employer] Inc.  Kidney Stones  Kidney stones (urolithiasis) are solid, rock-like deposits that form inside of the organs that make urine (kidneys). A kidney stone may form in a kidney and move into the bladder, where it can cause intense pain and block the flow of urine. Kidney stones are created when high levels of certain minerals are found in the urine. They are usually passed through urination, but in some cases, medical treatment may be needed to remove them. What are the causes? Kidney stones may be caused by:  A condition in which certain glands produce too much parathyroid hormone (primary hyperparathyroidism), which causes too much calcium buildup in the blood.  Buildup of uric acid crystals in the bladder (hyperuricosuria). Uric acid is a chemical that the body produces when you eat certain foods. It usually exits the body in the urine.  Narrowing (stricture) of one or both of the tubes that drain urine from the kidneys to the bladder (ureters).  A kidney blockage that is present at birth (congenital obstruction).  Past surgery on the kidney or the ureters, such as gastric bypass surgery. What increases the risk? The following factors make you more likely to develop kidney stones:  Having had a kidney stone in the  past.  Having a family history of kidney stones.  Not drinking enough water.  Eating a diet that is high in protein, salt (sodium), or sugar.  Being overweight or obese. What are the signs or symptoms? Symptoms of a kidney stone may include:  Nausea.  Vomiting.  Blood in the urine (hematuria).  Pain in the side of the abdomen, right below the ribs (flank pain). Pain usually spreads (radiates) to the groin.  Needing to urinate frequently or urgently. How is this diagnosed? This condition may be diagnosed based on:  Your medical history.  A physical exam.  Blood tests.  Urine tests.  CT scan.  Abdominal X-ray.  A procedure to examine the inside of the bladder (cystoscopy). How is this treated? Treatment for kidney stones depends on the size, location, and makeup of the stones. Treatment may involve:  Analyzing your urine before and after you pass the stone through urination.  Being monitored at the hospital until you pass the stone through urination.  Increasing your fluid intake and decreasing the amount of calcium and protein in your diet.  A procedure to break up kidney stones in the bladder using: ? A focused beam of light (laser therapy). ? Shock waves (extracorporeal shock wave lithotripsy).  Surgery to remove kidney stones. This may be needed if you have severe pain or have stones that block your urinary tract. Follow these instructions at home: Eating and drinking  Drink enough fluid to keep your urine clear or pale yellow. This will help you to pass the kidney stone.  If directed, change your diet. This may include: ? Limiting how much sodium you eat. ? Eating more fruits and vegetables. ? Limiting how much meat, poultry, fish, and eggs you eat.  Follow instructions from your health care provider about eating or drinking restrictions. General instructions  Collect urine samples as told by your health care provider. You may need to collect a urine  sample: ? 24 hours after you pass the stone. ? 8-12 weeks after passing the kidney stone, and  every 6-12 months after that.  Strain your urine every time you urinate, for as long as directed. Use the strainer that your health care provider recommends.  Do not throw out the kidney stone after passing it. Keep the stone so it can be tested by your health care provider. Testing the makeup of your kidney stone may help prevent you from getting kidney stones in the future.  Take over-the-counter and prescription medicines only as told by your health care provider.  Keep all follow-up visits as told by your health care provider. This is important. You may need follow-up X-rays or ultrasounds to make sure that your stone has passed. How is this prevented? To prevent another kidney stone:  Drink enough fluid to keep your urine clear or pale yellow. This is the best way to prevent kidney stones.  Eat a healthy diet and follow recommendations from your health care provider about foods to avoid. You may be instructed to eat a low-protein diet. Recommendations vary depending on the type of kidney stone that you have.  Maintain a healthy weight. Contact a health care provider if:  You have pain that gets worse or does not get better with medicine. Get help right away if:  You have a fever or chills.  You develop severe pain.  You develop new abdominal pain.  You faint.  You are unable to urinate. This information is not intended to replace advice given to you by your health care provider. Make sure you discuss any questions you have with your health care provider. Document Released: 10/06/2005 Document Revised: 03/19/2017 Document Reviewed: 03/21/2016 Elsevier Interactive Patient Education  2019 [employer]

## 2019-03-11 NOTE — Progress Notes (Signed)
Virtual Visit via Video Note  I connected with Jonathan [name]  on 03/11/19 at  8:52 AM EDT by a video enabled telemedicine application and verified that I am speaking with the correct person using two identifiers.  Location patient: home Location provider:work  Persons participating in the virtual visit: patient, provider  I discussed the limitations of evaluation and management by telemedicine and the availability of in person appointments. The patient expressed understanding and agreed to proceed.   HPI: 1. HTN BP still elevated on hctz 25 mg qd and losartan 25 mg qd BP 03/08/2019 144/88  2. DM 2 A1C 09/10/18 7.6 on metformin 500 mg bdi  3. Kidney stones passed 1/3 doing well FH dad had kidney stones f/u in 05/2019 with Dr. Bernardo Heater    ROS: See pertinent positives and negatives per HPI.  Past Medical History:  Diagnosis Date  . Diabetes mellitus without complication ([place])   . Hyperlipidemia   . Hypertension   . Obesity     Past Surgical History:  Procedure Laterality Date  . COLONOSCOPY WITH PROPOFOL N/A 06/18/2018   Procedure: COLONOSCOPY WITH PROPOFOL;  Surgeon: Virgel Manifold, MD;  Location: [facility] ENDOSCOPY;  Service: Endoscopy;  Laterality: N/A;  . TONSILLECTOMY     age 52 or 89   . WISDOM TOOTH EXTRACTION      Family History  Problem Relation Age of Onset  . Cancer Mother        breast  . CAD Father   . Emphysema Father   . Heart disease Father        CABG    SOCIAL HX: married working    Current Outpatient Medications:  .  Cholecalciferol (D3-1000 PO), Take 2,000 Units by mouth., Disp: , Rfl:  .  hydrochlorothiazide (HYDRODIURIL) 25 MG tablet, Take 1 tablet (25 mg total) by mouth daily. In am, Disp: 90 tablet, Rfl: 3 .  losartan (COZAAR) 50 MG tablet, Take 1 tablet (50 mg total) by mouth daily. In am, Disp: 90 tablet, Rfl: 3 .  metFORMIN (GLUCOPHAGE) 500 MG tablet, Take 1 tablet (500 mg total) by mouth 2 (two) times daily with a meal., Disp: 180 tablet, Rfl:  3 .  tamsulosin (FLOMAX) 0.4 MG CAPS capsule, Take 1 capsule (0.4 mg total) by mouth daily., Disp: 14 capsule, Rfl: 0 .  triamcinolone cream (KENALOG) 0.1 %, Apply 1 application topically 2 (two) times daily. Left leg, Disp: 45 g, Rfl: 0  EXAM:  VITALS per patient if applicable:  GENERAL: alert, oriented, appears well and in no acute distress  HEENT: atraumatic, conjunttiva clear, no obvious abnormalities on inspection of external nose and ears  NECK: normal movements of the head and neck  LUNGS: on inspection no signs of respiratory distress, breathing rate appears normal, no obvious gross SOB, gasping or wheezing  CV: no obvious cyanosis  MS: moves all visible extremities without noticeable abnormality  PSYCH/NEURO: pleasant and cooperative, no obvious depression or anxiety, speech and thought processing grossly intact  ASSESSMENT AND PLAN:  Discussed the following assessment and plan:  Essential hypertension - Plan: losartan (COZAAR) 50 MG tablet increase from 25 mg qd, hctz 25 mg qd  -log BP  rec exercise and healthy diet   Type 2 diabetes mellitus A1C 7.6  Plan: metFORMIN (GLUCOPHAGE) 500 MG tablet bid  Do foot exam in future and consider pna 23  Will ask about eye exam in future  Check fasting labs 04/27/2019   Kidney stones F/u with Dr. Bernardo Heater in 3  months 05/2019   HM Had flu shot 08/11/18  Tdaputd Disc shingrix in future Protectedhep B, MMR immune declines std check  PSA nl and DRE normal 09/10/18 -check PSA 04/2019 colonoscopysch 06/18/18 tortuous colon f/u in 5 years ? M. Uncle colon cancer  No need for dermatology referral nowconsider in future Used to chew chewing tobacco   [facility] saw 2018 wears glasses    I discussed the assessment and treatment plan with the patient. The patient was provided an opportunity to ask questions and all were answered. The patient agreed with the plan and demonstrated an understanding of the instructions.    The patient was advised to call back or seek an in-person evaluation if the symptoms worsen or if the condition fails to improve as anticipated.  Time spent 15 minutes  Delorise Jackson, MD

## 2019-03-11 NOTE — Progress Notes (Signed)
resently had kidney stones and was treated, due for PNEUMOOCCAL vac

## 2019-03-21 NOTE — Progress Notes (Signed)
Called and schedule lab appt for April 30, 2019 and a f/up on 07/01/2019 with provider.  Jonathan [name],cma

## 2019-04-08 NOTE — Progress Notes (Signed)
Stone analysis was 100% uric acid. Has he done his 24 hour urine study yet?  LMOM for patient to return call. 

## 2019-04-11 ENCOUNTER — Telehealth: Payer: Self-pay | Admitting: Family Medicine

## 2019-04-11 ENCOUNTER — Encounter: Payer: Self-pay | Admitting: Family Medicine

## 2019-04-11 NOTE — Telephone Encounter (Signed)
Unable to reach patient. Sent a Therapist, music

## 2019-04-11 NOTE — Telephone Encounter (Signed)
Stone analysis was 100% uric acid. Has he done his 24 hour urine study yet?  LMOM for patient to return call.

## 2019-04-29 ENCOUNTER — Other Ambulatory Visit: Payer: Self-pay

## 2019-04-29 ENCOUNTER — Other Ambulatory Visit (INDEPENDENT_AMBULATORY_CARE_PROVIDER_SITE_OTHER): Payer: Managed Care, Other (non HMO)

## 2019-04-29 DIAGNOSIS — E119 Type 2 diabetes mellitus without complications: Secondary | ICD-10-CM

## 2019-04-29 DIAGNOSIS — Z125 Encounter for screening for malignant neoplasm of prostate: Secondary | ICD-10-CM | POA: Diagnosis not present

## 2019-04-29 DIAGNOSIS — E559 Vitamin D deficiency, unspecified: Secondary | ICD-10-CM

## 2019-04-29 DIAGNOSIS — Z1329 Encounter for screening for other suspected endocrine disorder: Secondary | ICD-10-CM

## 2019-04-29 DIAGNOSIS — I10 Essential (primary) hypertension: Secondary | ICD-10-CM

## 2019-04-29 LAB — COMPREHENSIVE METABOLIC PANEL
ALT: 21 U/L (ref 0–53)
AST: 16 U/L (ref 0–37)
Albumin: 4.3 g/dL (ref 3.5–5.2)
Alkaline Phosphatase: 116 U/L (ref 39–117)
BUN: 17 mg/dL (ref 6–23)
CO2: 29 mEq/L (ref 19–32)
Calcium: 9.2 mg/dL (ref 8.4–10.5)
Chloride: 100 mEq/L (ref 96–112)
Creatinine, Ser: 1.18 mg/dL (ref 0.40–1.50)
GFR: 64.61 mL/min (ref >60.00)
Glucose, Bld: 149 mg/dL — ABNORMAL HIGH (ref 70–99)
Potassium: 4.3 mEq/L (ref 3.5–5.1)
Sodium: 138 mEq/L (ref 135–145)
Total Bilirubin: 0.5 mg/dL (ref 0.2–1.2)
Total Protein: 7.1 g/dL (ref 6.0–8.3)

## 2019-04-29 LAB — VITAMIN D 25 HYDROXY (VIT D DEFICIENCY, FRACTURES): VITD: 36.47 ng/mL (ref 30.00–100.00)

## 2019-04-29 LAB — CBC WITH DIFFERENTIAL/PLATELET
Basophils Absolute: 0 10*3/uL (ref 0.0–0.1)
Basophils Relative: 0.6 % (ref 0.0–3.0)
Eosinophils Absolute: 0.1 10*3/uL (ref 0.0–0.7)
Eosinophils Relative: 1.2 % (ref 0.0–5.0)
HCT: 44.8 % (ref 39.0–52.0)
Hemoglobin: 15.1 g/dL (ref 13.0–17.0)
Lymphocytes Relative: 27.6 % (ref 12.0–46.0)
Lymphs Abs: 2.2 10*3/uL (ref 0.7–4.0)
MCHC: 33.7 g/dL (ref 30.0–36.0)
MCV: 86 fl (ref 78.0–100.0)
Monocytes Absolute: 0.7 10*3/uL (ref 0.1–1.0)
Monocytes Relative: 9 % (ref 3.0–12.0)
Neutro Abs: 4.9 10*3/uL (ref 1.4–7.7)
Neutrophils Relative %: 61.6 % (ref 43.0–77.0)
Platelets: 309 10*3/uL (ref 150.0–400.0)
RBC: 5.21 Mil/uL (ref 4.22–5.81)
RDW: 13.5 % (ref 11.5–15.5)
WBC: 7.9 10*3/uL (ref 4.0–10.5)

## 2019-04-29 LAB — LIPID PANEL
Cholesterol: 146 mg/dL (ref 0–200)
HDL: 38.3 mg/dL — ABNORMAL LOW (ref >39.00)
LDL Cholesterol: 86 mg/dL (ref 0–99)
NonHDL: 107.57
Total CHOL/HDL Ratio: 4
Triglycerides: 106 mg/dL (ref 0.0–149.0)
VLDL: 21.2 mg/dL (ref 0.0–40.0)

## 2019-04-29 LAB — HEMOGLOBIN A1C: Hgb A1c MFr Bld: 7.4 % — ABNORMAL HIGH (ref 4.6–6.5)

## 2019-04-29 LAB — TSH: TSH: 3.6 u[IU]/mL (ref 0.35–4.50)

## 2019-04-29 LAB — PSA: PSA: 0.65 ng/mL (ref 0.10–4.00)

## 2019-04-30 LAB — PROTEIN / CREATININE RATIO, URINE
Creatinine, Urine: 166 mg/dL (ref 20–320)
Protein/Creat Ratio: 60 mg/g creat (ref 22–128)
Protein/Creatinine Ratio: 0.06 mg/mg creat (ref 0.022–0.12)
Total Protein, Urine: 10 mg/dL (ref 5–25)

## 2019-05-17 ENCOUNTER — Other Ambulatory Visit: Payer: Self-pay | Admitting: Urology

## 2019-05-17 MED ORDER — TAMSULOSIN HCL 0.4 MG PO CAPS: 0.4000 mg | ORAL_CAPSULE | Freq: Every day | ORAL | 0 refills | Status: DC

## 2019-05-17 NOTE — Telephone Encounter (Signed)
Refill sent in to [facility] 

## 2019-05-17 NOTE — Telephone Encounter (Signed)
Pt would like a refill for Flomax. Please advise.

## 2019-05-27 ENCOUNTER — Other Ambulatory Visit: Payer: Self-pay | Admitting: Internal Medicine

## 2019-05-27 ENCOUNTER — Encounter: Payer: Self-pay | Admitting: Internal Medicine

## 2019-05-27 DIAGNOSIS — E1165 Type 2 diabetes mellitus with hyperglycemia: Secondary | ICD-10-CM

## 2019-05-27 MED ORDER — SITAGLIPTIN PHOSPHATE 25 MG PO TABS: 25.0000 mg | ORAL_TABLET | Freq: Every day | ORAL | 3 refills | Status: DC

## 2019-06-13 ENCOUNTER — Encounter: Payer: Self-pay | Admitting: Urology

## 2019-06-17 ENCOUNTER — Ambulatory Visit: Payer: Managed Care, Other (non HMO) | Admitting: Urology

## 2019-07-01 ENCOUNTER — Ambulatory Visit: Payer: Managed Care, Other (non HMO)

## 2019-07-01 ENCOUNTER — Ambulatory Visit: Payer: Managed Care, Other (non HMO) | Admitting: Internal Medicine

## 2019-07-05 ENCOUNTER — Other Ambulatory Visit: Payer: Self-pay | Admitting: Internal Medicine

## 2019-07-05 DIAGNOSIS — E119 Type 2 diabetes mellitus without complications: Secondary | ICD-10-CM

## 2019-07-05 MED ORDER — METFORMIN HCL 500 MG PO TABS: 500.0000 mg | ORAL_TABLET | Freq: Two times a day (BID) | ORAL | 3 refills | Status: DC

## 2019-07-08 ENCOUNTER — Ambulatory Visit (INDEPENDENT_AMBULATORY_CARE_PROVIDER_SITE_OTHER): Payer: Managed Care, Other (non HMO)

## 2019-07-08 ENCOUNTER — Other Ambulatory Visit: Payer: Self-pay

## 2019-07-08 DIAGNOSIS — Z23 Encounter for immunization: Secondary | ICD-10-CM | POA: Diagnosis not present

## 2019-07-15 ENCOUNTER — Other Ambulatory Visit: Payer: Self-pay

## 2019-07-15 ENCOUNTER — Encounter: Payer: Self-pay | Admitting: Internal Medicine

## 2019-07-15 ENCOUNTER — Ambulatory Visit (INDEPENDENT_AMBULATORY_CARE_PROVIDER_SITE_OTHER): Payer: Managed Care, Other (non HMO) | Admitting: Internal Medicine

## 2019-07-15 VITALS — Ht 70.0 in | Wt 330.0 lb

## 2019-07-15 DIAGNOSIS — E119 Type 2 diabetes mellitus without complications: Secondary | ICD-10-CM | POA: Diagnosis not present

## 2019-07-15 DIAGNOSIS — Z0000 Encounter for general adult medical examination without abnormal findings: Secondary | ICD-10-CM | POA: Diagnosis not present

## 2019-07-15 DIAGNOSIS — I10 Essential (primary) hypertension: Secondary | ICD-10-CM | POA: Diagnosis not present

## 2019-07-15 DIAGNOSIS — N200 Calculus of kidney: Secondary | ICD-10-CM

## 2019-07-15 NOTE — Progress Notes (Signed)
Virtual Visit via Video Note  I connected with Jonathan [name]  on 07/15/19 at  4:30 PM EDT by a video enabled telemedicine application and verified that I am speaking with the correct person using two identifiers.  Location patient: home Location provider:work or home office Persons participating in the virtual visit: patient, provider  I discussed the limitations of evaluation and management by telemedicine and the availability of in person appointments. The patient expressed understanding and agreed to proceed.   HPI: 1. HTN on hctz 25 mg qd, losartan 50 mg qd not checking BP no cuff advised to get. Denies h/a  2. DM 2 A1C 7.4 on januvia 25 mg qd and metofrmin 500 mg bid  He reports he likes meat and potatoes but overdos on portion size  He has not weighed himself thinking he weighs 334 lbs  3. Stress due to dad dx'ed neuroendocrine tumor on chemo and progressed to liver and stomach and he is sole caretaker we disc FMLA paperwork if it is needed by him 4. Annual   ROS: See pertinent positives and negatives per HPI. General: wt 334/5 HEENT: no sore throat  CV: no chest pain  Lungs: no sob  GI: no ab pain  MSK: no jt pain  Skin no issues Psych: +stress due to taking care of parents  Neuro: no h/a  GU: no issues h/o kidney stones x 2   Past Medical History:  Diagnosis Date  . Diabetes mellitus without complication ([place])   . Hyperlipidemia   . Hypertension   . Obesity     Past Surgical History:  Procedure Laterality Date  . COLONOSCOPY WITH PROPOFOL N/A 06/18/2018   Procedure: COLONOSCOPY WITH PROPOFOL;  Surgeon: Virgel Manifold, MD;  Location: [facility] ENDOSCOPY;  Service: Endoscopy;  Laterality: N/A;  . TONSILLECTOMY     age 16 or 50   . WISDOM TOOTH EXTRACTION      Family History  Problem Relation Age of Onset  . Cancer Mother        breast  . CAD Father   . Emphysema Father   . Heart disease Father        CABG  . 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  Former chewing tobacco Owns guns, wears seat belts, safe in relationship  He is sole caretaker of both his parents    Current Outpatient Medications:  .  Cholecalciferol (D3-1000 PO), Take 2,000 Units by mouth., Disp: , Rfl:  .  hydrochlorothiazide (HYDRODIURIL) 25 MG tablet, Take 1 tablet (25 mg total) by mouth daily. In am, Disp: 90 tablet, Rfl: 3 .  losartan (COZAAR) 50 MG tablet, Take 1 tablet (50 mg total) by mouth daily. In am, Disp: 90 tablet, Rfl: 3 .  metFORMIN (GLUCOPHAGE) 500 MG tablet, Take 1 tablet (500 mg total) by mouth 2 (two) times daily with a meal., Disp: 180 tablet, Rfl: 3 .  sitaGLIPtin (JANUVIA) 25 MG tablet, Take 1 tablet (25 mg total) by mouth daily., Disp: 90 tablet, Rfl: 3 .  tamsulosin (FLOMAX) 0.4 MG CAPS capsule, Take 1 capsule (0.4 mg total) by mouth daily., Disp: 14 capsule, Rfl: 0 .  triamcinolone cream (KENALOG) 0.1 %, Apply 1 application topically 2 (two) times daily. Left leg, Disp: 45 g, Rfl: 0  EXAM:  VITALS per patient if applicable:  GENERAL: alert, oriented, appears well and in no acute distress  HEENT: atraumatic, conjunttiva clear, no obvious abnormalities on inspection of external nose and ears  NECK: normal movements of the head and neck  LUNGS: on inspection no signs of respiratory distress, breathing rate appears normal, no obvious gross SOB, gasping or wheezing  CV: no obvious cyanosis  MS: moves all visible extremities without noticeable abnormality  PSYCH/NEURO: pleasant and cooperative, no obvious depression or anxiety, speech and thought processing grossly intact  ASSESSMENT AND PLAN:  Discussed the following assessment and plan:  Annual physical exam Had flu shotutd 2020   Tdaputd Consider pna 23 vaccine in future  Disc shingrix in future Protectedhep B, MMRimmune declines  std check  PSA nl and 04/2019 0.65 consider DRE in future for 2020  colonoscopysch 06/18/18 normal tortuous colon  -f/u in 5 years per GI  ?M. Uncle colon cancer No need for dermatology referral nowconsider in future Used to chew chewing tobacco  rec healthy diet and exercise disc The next 56 days lifestyle changes   Kidney stones uric acids  -rec increase water with lemon  -reduce meat as his were uric acid stones  Essential hypertension -cont meds  rec buy BP cuff   Type 2 diabetes mellitus without complication, without long-term current use of insulin (HCC) A1C 7.4  -rec healthy diet and exercise disc The next 56 days lifestyle changes  -eye exam [name] had 2020 get [name]  -consider statin Will need foot exam in future  Urine had 04/29/19  On [facility]   Of note disc with pt if needed will do FMLA for him on behalf he is taking care of his dad and the sole caretaker for both of his parents    -we discussed possible serious and likely etiologies, options for evaluation and workup, limitations of telemedicine visit vs in person visit, treatment, treatment risks and precautions. Pt prefers to treat via telemedicine empirically rather then risking or undertaking an in person visit at this moment. Patient agrees to seek prompt in person care if worsening, new symptoms arise, or if is not improving with treatment.   I discussed the assessment and treatment plan with the patient. The patient was provided an opportunity to ask questions and all were answered. The patient agreed with the plan and demonstrated an understanding of the instructions.   The patient was advised to call back or seek an in-person evaluation if the symptoms worsen or if the condition fails to improve as anticipated.  Time spent 20 minutes  Delorise Jackson, MD

## 2019-09-07 ENCOUNTER — Other Ambulatory Visit: Payer: Self-pay | Admitting: Internal Medicine

## 2019-09-07 DIAGNOSIS — I10 Essential (primary) hypertension: Secondary | ICD-10-CM

## 2019-09-07 MED ORDER — HYDROCHLOROTHIAZIDE 25 MG PO TABS: 25.0000 mg | ORAL_TABLET | Freq: Every day | ORAL | 3 refills | Status: DC

## 2019-10-11 ENCOUNTER — Other Ambulatory Visit: Payer: Self-pay

## 2019-10-11 ENCOUNTER — Other Ambulatory Visit (INDEPENDENT_AMBULATORY_CARE_PROVIDER_SITE_OTHER): Payer: Managed Care, Other (non HMO)

## 2019-10-11 DIAGNOSIS — E119 Type 2 diabetes mellitus without complications: Secondary | ICD-10-CM | POA: Diagnosis not present

## 2019-10-11 DIAGNOSIS — I10 Essential (primary) hypertension: Secondary | ICD-10-CM | POA: Diagnosis not present

## 2019-10-11 LAB — COMPREHENSIVE METABOLIC PANEL
ALT: 22 U/L (ref 0–53)
AST: 19 U/L (ref 0–37)
Albumin: 4.2 g/dL (ref 3.5–5.2)
Alkaline Phosphatase: 96 U/L (ref 39–117)
BUN: 16 mg/dL (ref 6–23)
CO2: 28 mEq/L (ref 19–32)
Calcium: 9.1 mg/dL (ref 8.4–10.5)
Chloride: 98 mEq/L (ref 96–112)
Creatinine, Ser: 1.24 mg/dL (ref 0.40–1.50)
GFR: 60.91 mL/min (ref >60.00)
Glucose, Bld: 112 mg/dL — ABNORMAL HIGH (ref 70–99)
Potassium: 4.1 mEq/L (ref 3.5–5.1)
Sodium: 135 mEq/L (ref 135–145)
Total Bilirubin: 0.6 mg/dL (ref 0.2–1.2)
Total Protein: 6.9 g/dL (ref 6.0–8.3)

## 2019-10-11 LAB — LIPID PANEL
Cholesterol: 146 mg/dL (ref 0–200)
HDL: 38.5 mg/dL — ABNORMAL LOW (ref >39.00)
LDL Cholesterol: 79 mg/dL (ref 0–99)
NonHDL: 107.09
Total CHOL/HDL Ratio: 4
Triglycerides: 141 mg/dL (ref 0.0–149.0)
VLDL: 28.2 mg/dL (ref 0.0–40.0)

## 2019-10-11 LAB — HEMOGLOBIN A1C: Hgb A1c MFr Bld: 6.8 % — ABNORMAL HIGH (ref 4.6–6.5)

## 2019-10-12 ENCOUNTER — Encounter: Payer: Self-pay | Admitting: Internal Medicine

## 2019-10-12 ENCOUNTER — Ambulatory Visit (INDEPENDENT_AMBULATORY_CARE_PROVIDER_SITE_OTHER): Payer: Managed Care, Other (non HMO) | Admitting: Internal Medicine

## 2019-10-12 ENCOUNTER — Other Ambulatory Visit: Payer: Managed Care, Other (non HMO)

## 2019-10-12 DIAGNOSIS — E119 Type 2 diabetes mellitus without complications: Secondary | ICD-10-CM | POA: Diagnosis not present

## 2019-10-12 DIAGNOSIS — I10 Essential (primary) hypertension: Secondary | ICD-10-CM

## 2019-10-12 DIAGNOSIS — F4321 Adjustment disorder with depressed mood: Secondary | ICD-10-CM | POA: Diagnosis not present

## 2019-10-12 NOTE — Progress Notes (Signed)
Virtual Visit via Video Note  I connected with Jonathan [name]  on 10/12/19 at  8:00 AM EST by a video enabled telemedicine application and verified that I am speaking with the correct person using two identifiers.  Location patient: home Location provider:work or home office Persons participating in the virtual visit: patient, provider  I discussed the limitations of evaluation and management by telemedicine and the availability of in person appointments. The patient expressed understanding and agreed to proceed.   HPI: 1. DM 2 improved A1C 6.8 on metformin 500 mg bid and Januvia 25 mg every day  2. HTN not checking BP on losartan 50 and hydrochlorothiazide 25 and HLD improved  3. Grief father died 09/24/2019 NET and granddaughter died 09-07-19 and she was 51.53 years old and born premature and died in her sleep    ROS: See pertinent positives and negatives per HPI.  Past Medical History:  Diagnosis Date  . Diabetes mellitus without complication ([place])   . Hyperlipidemia   . Hypertension   . Obesity     Past Surgical History:  Procedure Laterality Date  . COLONOSCOPY WITH PROPOFOL N/A 06/18/2018   Procedure: COLONOSCOPY WITH PROPOFOL;  Surgeon: Virgel Manifold, MD;  Location: [facility] ENDOSCOPY;  Service: Endoscopy;  Laterality: N/A;  . TONSILLECTOMY     age 53 or 53   . WISDOM TOOTH EXTRACTION      Family History  Problem Relation Age of Onset  . Cancer Mother        breast  . CAD Father   . Emphysema Father   . Heart disease Father        CABG  . 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  Cholecalciferol (D3-1000 PO), Take 2,000 Units by mouth., Disp: , Rfl:  .   hydrochlorothiazide (HYDRODIURIL) 25 MG tablet, Take 1 tablet (25 mg total) by mouth daily. In am, Disp: 90 tablet, Rfl: 3 .  losartan (COZAAR) 50 MG tablet, Take 1 tablet (50 mg total) by mouth daily. In am, Disp: 90 tablet, Rfl: 3 .  metFORMIN (GLUCOPHAGE) 500 MG tablet, Take 1 tablet (500 mg total) by mouth 2 (two) times daily with a meal., Disp: 180 tablet, Rfl: 3 .  sitaGLIPtin (JANUVIA) 25 MG tablet, Take 1 tablet (25 mg total) by mouth daily., Disp: 90 tablet, Rfl: 3 .  tamsulosin (FLOMAX) 0.4 MG CAPS capsule, Take 1 capsule (0.4 mg total) by mouth daily., Disp: 14 capsule, Rfl: 0 .  triamcinolone cream (KENALOG) 0.1 %, Apply 1 application topically 2 (two) times daily. Left leg, Disp: 45 g, Rfl: 0  EXAM:  VITALS per patient if applicable:  GENERAL: alert, oriented, appears well and in no acute distress  HEENT: atraumatic, conjunttiva clear, no obvious abnormalities on inspection of external nose and ears  NECK: normal movements of the head and neck  LUNGS: on inspection no signs of respiratory distress, breathing rate appears normal, no obvious gross SOB, gasping or wheezing  CV: no obvious cyanosis  MS: moves all visible extremities without noticeable abnormality  PSYCH/NEURO: pleasant and cooperative, no obvious depression or anxiety, speech  and thought processing grossly intact  ASSESSMENT AND PLAN:  Discussed the following assessment and plan:  Essential hypertension Monitor BP rec check BP Cont meds  Type 2 diabetes mellitus without complication, without long-term current use of insulin A1C improved to 6.8 from 7.4  Cont meds  Foot exam at f/u  Disc pna 23 at f/u  Get records [facility] exam ROI sent  Healthy diet and exercise   HM Had flu shotutd 2020   Tdaputd Consider pna 23 vaccine in future  Disc shingrix in future Protectedhep B, MMRimmune declines std check  PSA nl and 04/2019 0.65 consider DRE in future for 2020  colonoscopysch 06/18/18  normal tortuous colon  -f/u in 5 years per GI  ?M. Uncle colon cancer No need for dermatology referral nowconsider in future Used to chew chewing tobacco  rec healthy diet and exercise disc The next 56 days lifestyle changes   -we discussed possible serious and likely etiologies, options for evaluation and workup, limitations of telemedicine visit vs in person visit, treatment, treatment risks and precautions. Pt prefers to treat via telemedicine empirically rather then risking or undertaking an in person visit at this moment. Patient agrees to seek prompt in person care if worsening, new symptoms arise, or if is not improving with treatment.   I discussed the assessment and treatment plan with the patient. The patient was provided an opportunity to ask questions and all were answered. The patient agreed with the plan and demonstrated an understanding of the instructions.   The patient was advised to call back or seek an in-person evaluation if the symptoms worsen or if the condition fails to improve as anticipated.  Time spent 15 minutes  Delorise Jackson, MD

## 2019-10-13 ENCOUNTER — Telehealth: Payer: Self-pay | Admitting: Internal Medicine

## 2019-10-13 NOTE — Telephone Encounter (Signed)
LVM to set up 23m follow up appt

## 2019-10-19 ENCOUNTER — Ambulatory Visit: Payer: Managed Care, Other (non HMO) | Admitting: Internal Medicine

## 2019-10-20 ENCOUNTER — Encounter: Payer: Self-pay | Admitting: Internal Medicine

## 2019-12-30 ENCOUNTER — Ambulatory Visit: Payer: Managed Care, Other (non HMO) | Attending: Internal Medicine

## 2019-12-30 DIAGNOSIS — Z23 Encounter for immunization: Secondary | ICD-10-CM

## 2019-12-30 NOTE — Progress Notes (Signed)
   Covid-19 Vaccination Clinic  Name:  Jonathan [name]    MRN: [id number] DOB: 1966-10-16  12/30/2019  [name] was observed post Covid-19 immunization for 15 minutes without incident. He was provided with Vaccine Information Sheet and instruction to access the V-Safe system.   [name] was instructed to call 911 with any severe reactions post vaccine: Marland Kitchen Difficulty breathing  . Swelling of face and throat  . A fast heartbeat  . A bad rash all over body  . Dizziness and weakness   Immunizations Administered    Name Date Dose VIS Date Route   Pfizer COVID-19 Vaccine 12/30/2019 12:37 PM 0.3 mL 09/30/2019 Intramuscular   Manufacturer: [employer], [employer]   Lot: [id number]   NDC: [address]-9

## 2020-01-25 ENCOUNTER — Ambulatory Visit: Payer: Managed Care, Other (non HMO) | Attending: Internal Medicine

## 2020-01-25 DIAGNOSIS — Z23 Encounter for immunization: Secondary | ICD-10-CM

## 2020-01-25 NOTE — Progress Notes (Signed)
   Covid-19 Vaccination Clinic  Name:  Jonathan [name]    MRN: [id number] DOB: June 04, 1966  01/25/2020  [name] was observed post Covid-19 immunization for 15 minutes without incident. He was provided with Vaccine Information Sheet and instruction to access the V-Safe system.   [name] was instructed to call 911 with any severe reactions post vaccine: Marland Kitchen Difficulty breathing  . Swelling of face and throat  . A fast heartbeat  . A bad rash all over body  . Dizziness and weakness   Immunizations Administered    Name Date Dose VIS Date Route   Pfizer COVID-19 Vaccine 01/25/2020  1:25 PM 0.3 mL 09/30/2019 Intramuscular   Manufacturer: [employer], [employer]   Lot: [id number]   NDC: [address]-7

## 2020-02-06 IMAGING — CT CT RENAL STONE PROTOCOL
1 of 2 series · 15 of 32 positions shown, 19 images · non-contrast
Comparison: None.

CLINICAL DATA: 52-year-old male with acute RIGHT abdominal and
flank pain with nausea.

EXAM:
CT ABDOMEN AND PELVIS WITHOUT CONTRAST
TECHNIQUE: Multidetector CT imaging of the abdomen and pelvis was performed
following the standard protocol without IV contrast.

[Series 2: axial st · axial · 0.80mm/px · z∈[-511,-76]mm · 15 of 97 slices shown, 19 images]
[im 5/97  soft-tissue]
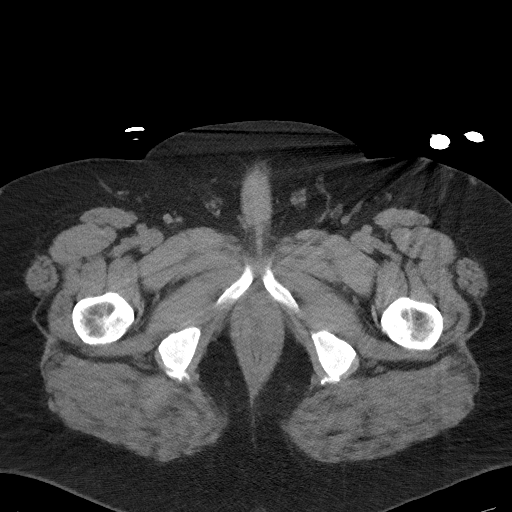
[im 5/97  bone]
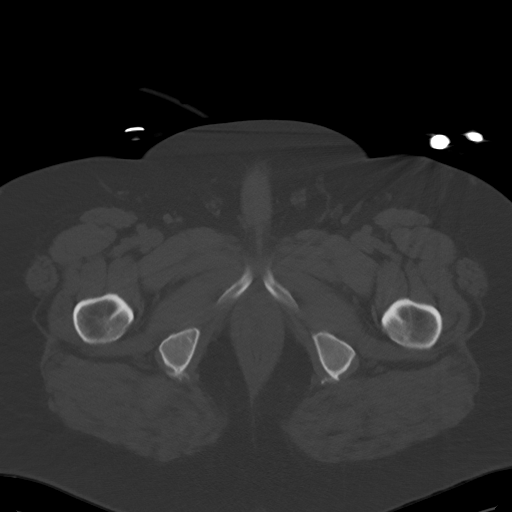
[im 13/97  soft-tissue]
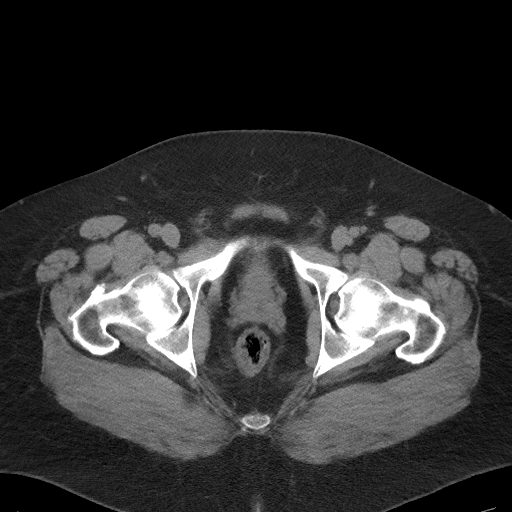
[im 21/97  soft-tissue]
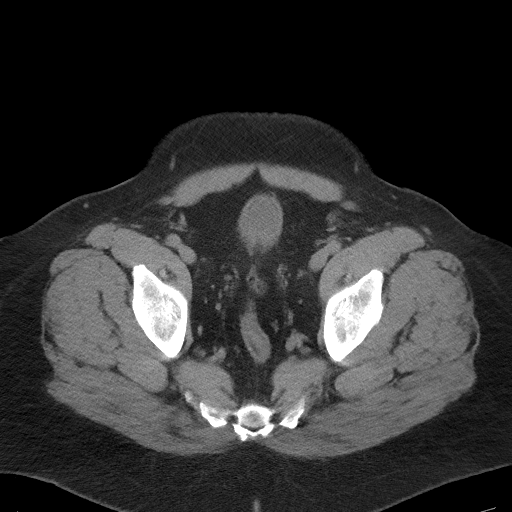
[im 26/97  soft-tissue]
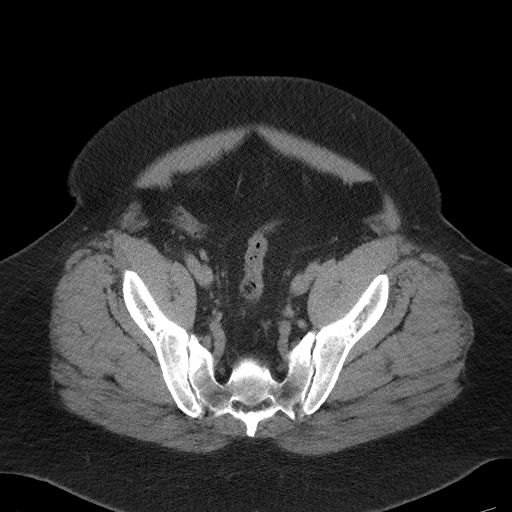
[im 34/97  soft-tissue]
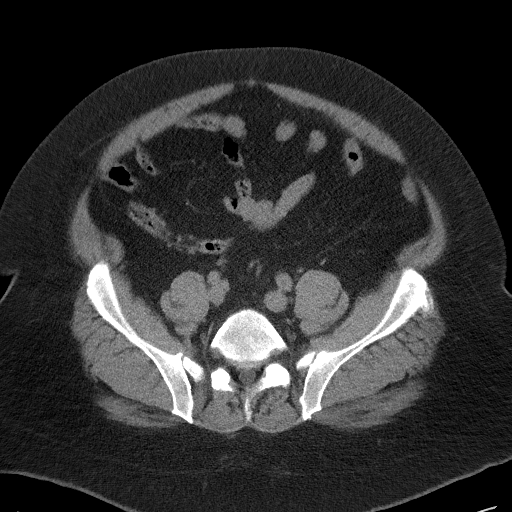
[im 42/97  soft-tissue]
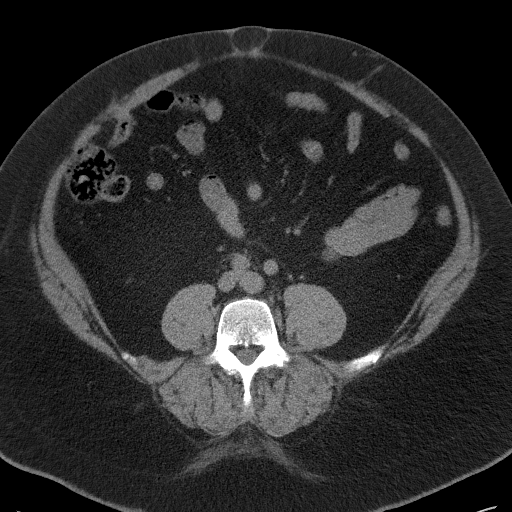
[im 51/97  soft-tissue]
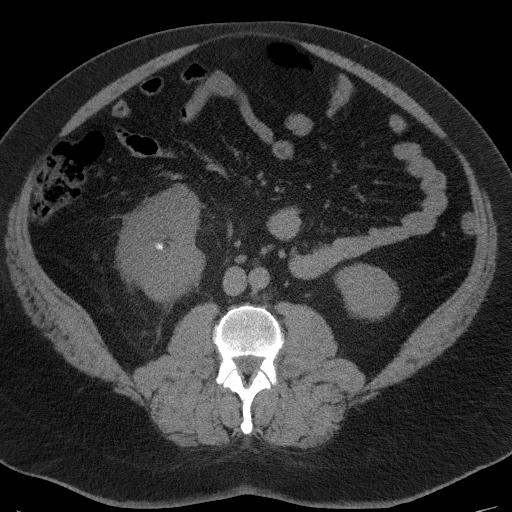
[im 55/97  soft-tissue]
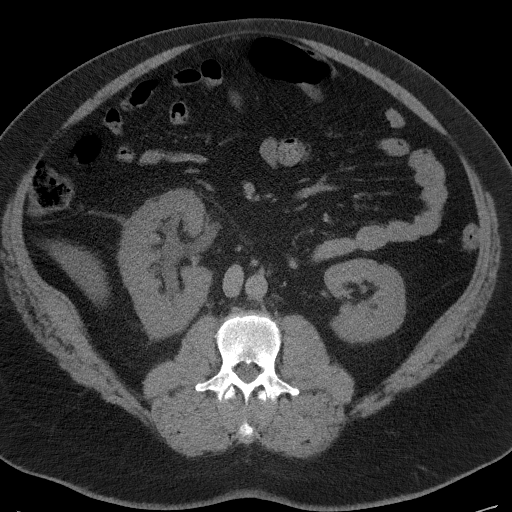
[im 63/97  soft-tissue]
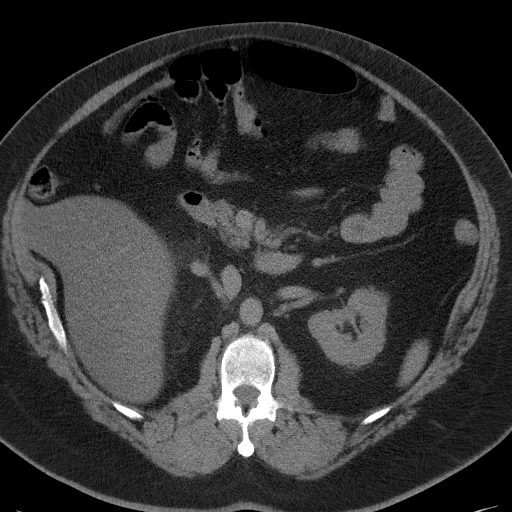
[im 63/97  bone]
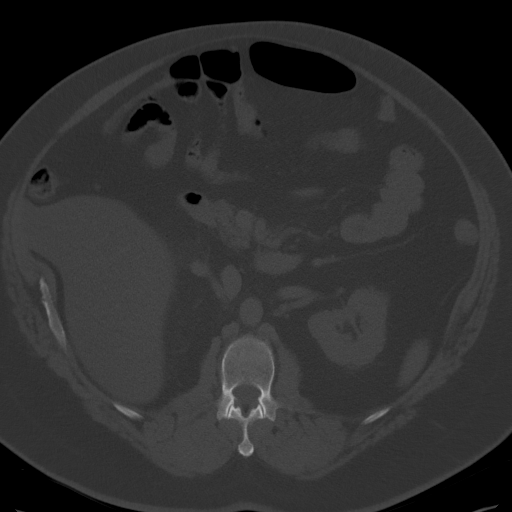
[im 71/97  soft-tissue]
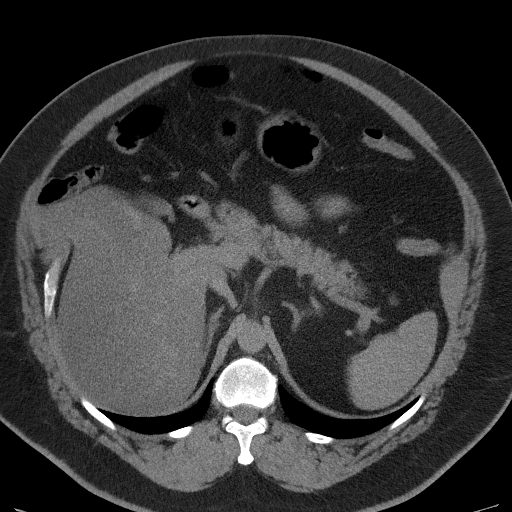
[im 76/97  soft-tissue]
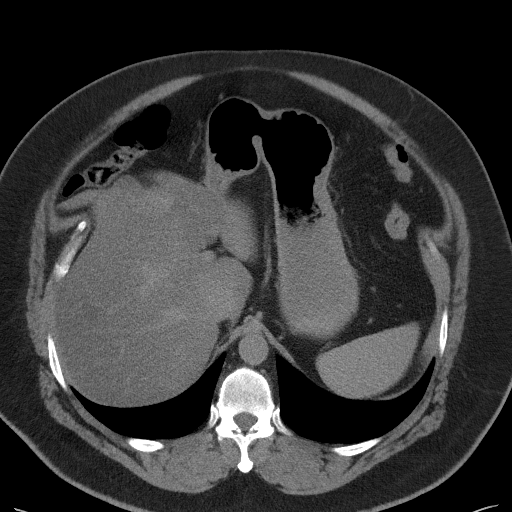
[im 80/97  lung]
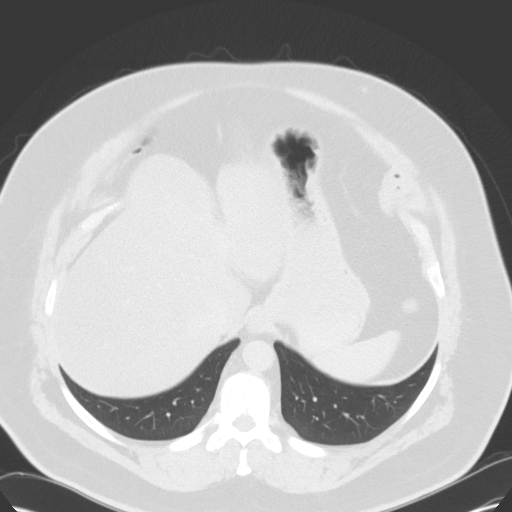
[im 84/97  soft-tissue]
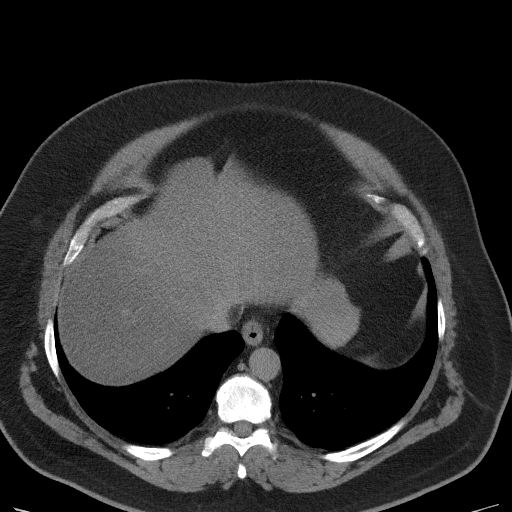
[im 84/97  lung]
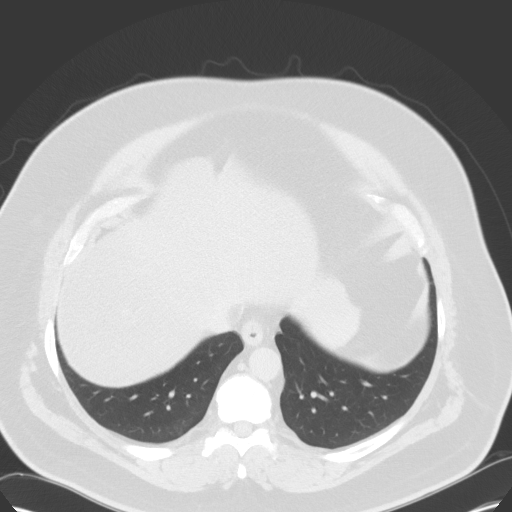
[im 88/97  lung]
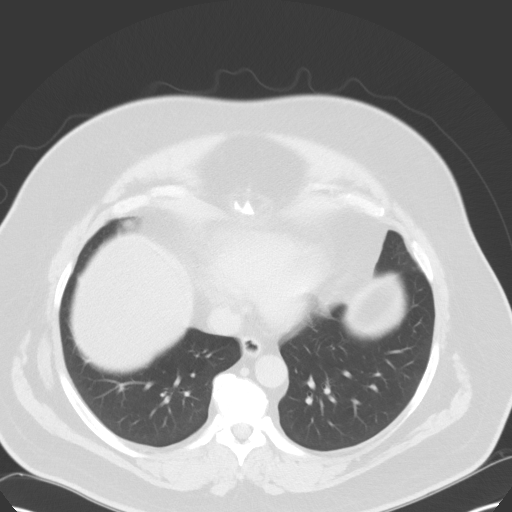
[im 92/97  soft-tissue]
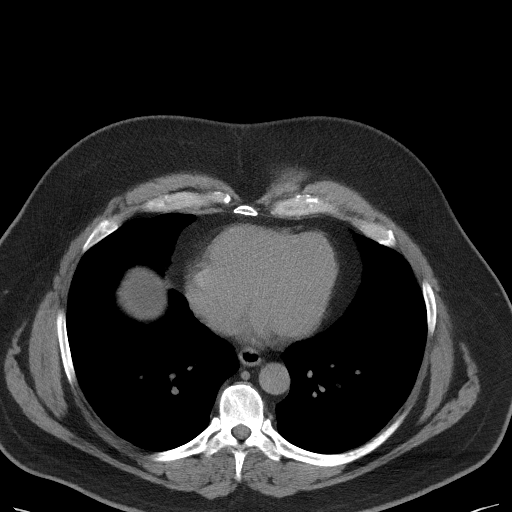
[im 92/97  lung]
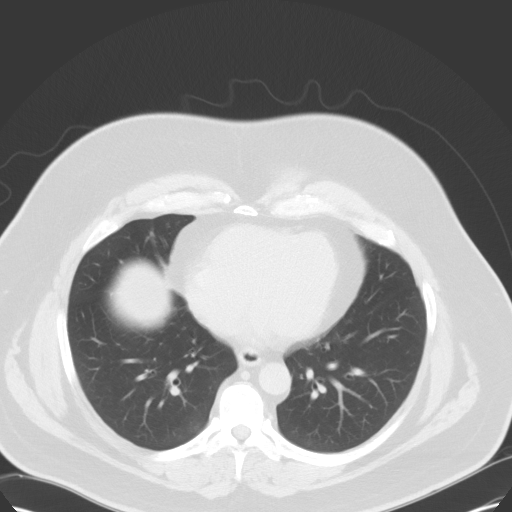

[15 of 32 positions shown; findings below may reference images not displayed]

FINDINGS: Please note that parenchymal abnormalities may be missed without
intravenous contrast.

Lower chest: No acute abnormalities.

Hepatobiliary: Hepatic steatosis identified without focal hepatic
lesions. The gallbladder is unremarkable. No biliary dilatation.

Pancreas: Unremarkable

Spleen: Unremarkable

Adrenals/Urinary Tract: A 4 mm proximal RIGHT ureteral calculus
causes moderate RIGHT hydronephrosis and perinephric inflammation. A
2 mm calculus and a 4 mm calculus within the RIGHT kidney are
nonobstructing.

The LEFT kidney, adrenal glands and bladder are unremarkable.

Stomach/Bowel: Stomach is within normal limits. Appendix appears
normal. No evidence of bowel wall thickening, distention, or
inflammatory changes.

Vascular/Lymphatic: No significant vascular findings are present. No
enlarged abdominal or pelvic lymph nodes.

Reproductive: Prostate calcifications noted.

Other: No ascites, focal collection or pneumoperitoneum. A small
umbilical hernia containing fat is noted.

Musculoskeletal: No acute or suspicious bony abnormalities
identified. Mild degenerative changes in the lumbar spine noted.
IMPRESSION: 1. 4 mm proximal RIGHT ureteral calculus causing moderate RIGHT
hydronephrosis.
2. RIGHT nephrolithiasis
3. Hepatic steatosis

## 2020-04-03 ENCOUNTER — Other Ambulatory Visit: Payer: Self-pay | Admitting: Internal Medicine

## 2020-04-03 DIAGNOSIS — I10 Essential (primary) hypertension: Secondary | ICD-10-CM

## 2020-04-03 MED ORDER — LOSARTAN POTASSIUM 50 MG PO TABS: 50.0000 mg | ORAL_TABLET | Freq: Every day | ORAL | 3 refills | Status: DC

## 2020-04-13 ENCOUNTER — Ambulatory Visit: Payer: Managed Care, Other (non HMO) | Admitting: Internal Medicine

## 2020-04-20 ENCOUNTER — Ambulatory Visit (INDEPENDENT_AMBULATORY_CARE_PROVIDER_SITE_OTHER): Payer: Managed Care, Other (non HMO) | Admitting: Internal Medicine

## 2020-04-20 ENCOUNTER — Telehealth: Payer: Self-pay | Admitting: Internal Medicine

## 2020-04-20 ENCOUNTER — Encounter: Payer: Self-pay | Admitting: Internal Medicine

## 2020-04-20 ENCOUNTER — Other Ambulatory Visit: Payer: Self-pay

## 2020-04-20 VITALS — BP 130/82 | HR 94 | Temp 98.7°F | Ht 70.0 in | Wt 332.0 lb

## 2020-04-20 DIAGNOSIS — E1159 Type 2 diabetes mellitus with other circulatory complications: Secondary | ICD-10-CM

## 2020-04-20 DIAGNOSIS — Z0100 Encounter for examination of eyes and vision without abnormal findings: Secondary | ICD-10-CM

## 2020-04-20 DIAGNOSIS — Z23 Encounter for immunization: Secondary | ICD-10-CM | POA: Diagnosis not present

## 2020-04-20 DIAGNOSIS — E119 Type 2 diabetes mellitus without complications: Secondary | ICD-10-CM | POA: Diagnosis not present

## 2020-04-20 DIAGNOSIS — Z1329 Encounter for screening for other suspected endocrine disorder: Secondary | ICD-10-CM

## 2020-04-20 DIAGNOSIS — I10 Essential (primary) hypertension: Secondary | ICD-10-CM | POA: Diagnosis not present

## 2020-04-20 DIAGNOSIS — Z125 Encounter for screening for malignant neoplasm of prostate: Secondary | ICD-10-CM | POA: Diagnosis not present

## 2020-04-20 DIAGNOSIS — E1165 Type 2 diabetes mellitus with hyperglycemia: Secondary | ICD-10-CM | POA: Diagnosis not present

## 2020-04-20 LAB — CBC WITH DIFFERENTIAL/PLATELET
Basophils Absolute: 0 10*3/uL (ref 0.0–0.1)
Basophils Relative: 0.3 % (ref 0.0–3.0)
Eosinophils Absolute: 0.2 10*3/uL (ref 0.0–0.7)
Eosinophils Relative: 2.1 % (ref 0.0–5.0)
HCT: 42.1 % (ref 39.0–52.0)
Hemoglobin: 14.5 g/dL (ref 13.0–17.0)
Lymphocytes Relative: 24.3 % (ref 12.0–46.0)
Lymphs Abs: 1.9 10*3/uL (ref 0.7–4.0)
MCHC: 34.4 g/dL (ref 30.0–36.0)
MCV: 86.4 fl (ref 78.0–100.0)
Monocytes Absolute: 0.6 10*3/uL (ref 0.1–1.0)
Monocytes Relative: 7.8 % (ref 3.0–12.0)
Neutro Abs: 5.3 10*3/uL (ref 1.4–7.7)
Neutrophils Relative %: 65.5 % (ref 43.0–77.0)
Platelets: 259 10*3/uL (ref 150.0–400.0)
RBC: 4.87 Mil/uL (ref 4.22–5.81)
RDW: 13.3 % (ref 11.5–15.5)
WBC: 8 10*3/uL (ref 4.0–10.5)

## 2020-04-20 LAB — COMPREHENSIVE METABOLIC PANEL
ALT: 25 U/L (ref 0–53)
AST: 22 U/L (ref 0–37)
Albumin: 4.3 g/dL (ref 3.5–5.2)
Alkaline Phosphatase: 101 U/L (ref 39–117)
BUN: 21 mg/dL (ref 6–23)
CO2: 24 mEq/L (ref 19–32)
Calcium: 8.9 mg/dL (ref 8.4–10.5)
Chloride: 101 mEq/L (ref 96–112)
Creatinine, Ser: 1.17 mg/dL (ref 0.40–1.50)
GFR: 65 mL/min (ref >60.00)
Glucose, Bld: 130 mg/dL — ABNORMAL HIGH (ref 70–99)
Potassium: 3.8 mEq/L (ref 3.5–5.1)
Sodium: 136 mEq/L (ref 135–145)
Total Bilirubin: 0.7 mg/dL (ref 0.2–1.2)
Total Protein: 6.9 g/dL (ref 6.0–8.3)

## 2020-04-20 LAB — LIPID PANEL
Cholesterol: 146 mg/dL (ref 0–200)
HDL: 40.1 mg/dL (ref >39.00)
LDL Cholesterol: 86 mg/dL (ref 0–99)
NonHDL: 105.63
Total CHOL/HDL Ratio: 4
Triglycerides: 100 mg/dL (ref 0.0–149.0)
VLDL: 20 mg/dL (ref 0.0–40.0)

## 2020-04-20 LAB — HEMOGLOBIN A1C: Hgb A1c MFr Bld: 6.9 % — ABNORMAL HIGH (ref 4.6–6.5)

## 2020-04-20 LAB — PSA: PSA: 0.45 ng/mL (ref 0.10–4.00)

## 2020-04-20 LAB — TSH: TSH: 2.99 u[IU]/mL (ref 0.35–4.50)

## 2020-04-20 MED ORDER — SITAGLIPTIN PHOSPHATE 25 MG PO TABS: 25.0000 mg | ORAL_TABLET | Freq: Every day | ORAL | 3 refills | Status: DC

## 2020-04-20 MED ORDER — METFORMIN HCL 500 MG PO TABS: 500.0000 mg | ORAL_TABLET | Freq: Two times a day (BID) | ORAL | 3 refills | Status: DC

## 2020-04-20 NOTE — Addendum Note (Signed)
Addended by: Tilford Pillar on: 04/20/2020 09:55 AM   Modules accepted: Orders

## 2020-04-20 NOTE — Progress Notes (Signed)
Patient presenting with right sided hand and knee pain with slight swelling. Rated 4/10 pain. Larey Seat this past Tuesday due to tripping at work. Pain and swelling has reduced since then. Knee is sore to touch and hand hurts to wiggle the fingers.   Patient flagged: Current status:  PATIENT IS OVERDUE FOR BMI FOLLOW UP PLAN BMI is estimated to be 47.6 based on the last recorded weight and height

## 2020-04-20 NOTE — Progress Notes (Signed)
Chief Complaint  Patient presents with  . Follow-up  . Hand Pain  . Knee Pain   F/u  1. HTN on losartan 50 mg, hctz 25 mg qd 2. DM 2 metformin 500 bid, januvia 25 mg qd will do labs today  3. Mech. Fall hurting right hand and right knee with swelling pain 4/10 tried ice and swelling improving right hand    Review of Systems  Constitutional: Negative for weight loss.  HENT: Negative for hearing loss.   Eyes: Negative for blurred vision.  Respiratory: Negative for shortness of breath.   Cardiovascular: Negative for chest pain.  Gastrointestinal: Negative for abdominal pain.  Musculoskeletal: Positive for falls and joint pain.  Skin: Negative for rash.  Neurological: Negative for headaches.  Psychiatric/Behavioral: Negative for depression.   Past Medical History:  Diagnosis Date  . Diabetes mellitus without complication ([place])   . Hyperlipidemia   . Hypertension   . Obesity    Past Surgical History:  Procedure Laterality Date  . COLONOSCOPY WITH PROPOFOL N/A 06/18/2018   Procedure: COLONOSCOPY WITH PROPOFOL;  Surgeon: Virgel Manifold, MD;  Location: [facility] ENDOSCOPY;  Service: Endoscopy;  Laterality: N/A;  . TONSILLECTOMY     age 79 or 52   . WISDOM TOOTH EXTRACTION     Family History  Problem Relation Age of Onset  . Cancer Mother        breast  . CAD Father   . Emphysema Father   . Heart disease Father        CABG  . Cancer Father        died 09/16/19 neuroendocrine tumor liver/stomach dx'ed age 13 y.o    Social History   Socioeconomic History  . Marital status: Married    Spouse name: Not on file  . Number of children: Not on file  . Years of education: Not on file  . Highest education level: Not on file  Occupational History  . Not on file  Tobacco Use  . Smoking status: Never Smoker  . Smokeless tobacco: Former Network engineer  . Vaping Use: Never used  Substance and Sexual Activity  . Alcohol use: Yes  . Drug use: No  . Sexual activity: Yes   Other Topics Concern  . 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 Difficulty of Paying Living Expenses:   Food Insecurity:   . Worried About Charity fundraiser in the Last Year:   . Arboriculturist in the Last Year:   Transportation Needs:   . Film/video editor (Medical):   Marland Kitchen Lack of Transportation (Non-Medical):   Physical Activity:   . Days of Exercise per Week:   . Minutes of Exercise per Session:   Stress:   . Feeling of Stress :   Social Connections:   . Frequency of Communication with Friends and Family:   . Frequency of Social Gatherings with Friends and Family:   . Attends Religious Services:   . Active Member of Clubs or Organizations:   . Attends Archivist Meetings:   .  Marital Status:   Intimate Partner Violence:   . Fear of Current or Ex-Partner:   . Emotionally Abused:   Marland Kitchen Physically Abused:   . Sexually Abused:    Current Meds  Medication Sig  . Cholecalciferol (D3-1000 PO) Take 2,000 Units by mouth.  . hydrochlorothiazide (HYDRODIURIL) 25 MG tablet Take 1 tablet (25 mg total) by mouth daily. In am  . losartan (COZAAR) 50 MG tablet Take 1 tablet (50 mg total) by mouth daily. In am  . metFORMIN (GLUCOPHAGE) 500 MG tablet Take 1 tablet (500 mg total) by mouth 2 (two) times daily with a meal.  . sitaGLIPtin (JANUVIA) 25 MG tablet Take 1 tablet (25 mg total) by mouth daily.  . [DISCONTINUED] metFORMIN (GLUCOPHAGE) 500 MG tablet Take 1 tablet (500 mg total) by mouth 2 (two) times daily with a meal.  . [DISCONTINUED] sitaGLIPtin (JANUVIA) 25 MG tablet Take 1 tablet (25 mg total) by mouth daily.   No Known Allergies No results found for this or any previous  visit (from the past 2160 hour(s)). Objective  Body mass index is 47.64 kg/m. Wt Readings from Last 3 Encounters:  04/20/20 (!) 332 lb (150.6 kg)  07/15/19 (!) 330 lb (149.7 kg)  03/08/19 (!) 328 lb (148.8 kg)   Temp Readings from Last 3 Encounters:  04/20/20 98.7 F (37.1 C) (Oral)  02/27/19 98.2 F (36.8 C) (Oral)  09/10/18 98 F (36.7 C) (Oral)   BP Readings from Last 3 Encounters:  04/20/20 130/82  03/08/19 (!) 144/89  02/27/19 (!) 161/106   Pulse Readings from Last 3 Encounters:  04/20/20 94  03/08/19 100  02/27/19 (!) 101    Physical Exam Vitals and nursing note reviewed.  Constitutional:      Appearance: Normal appearance. He is well-developed and well-groomed. He is morbidly obese.  HENT:     Head: Normocephalic and atraumatic.  Eyes:     Conjunctiva/sclera: Conjunctivae normal.     Pupils: Pupils are equal, round, and reactive to light.  Cardiovascular:     Rate and Rhythm: Normal rate and regular rhythm.     Heart sounds: Normal heart sounds. No murmur heard.   Pulmonary:     Effort: Pulmonary effort is normal.     Breath sounds: Normal breath sounds.  Skin:    General: Skin is warm and dry.  Neurological:     General: No focal deficit present.     Mental Status: He is alert and oriented to person, place, and time. Mental status is at baseline.     Gait: Gait normal.  Psychiatric:        Attention and Perception: Attention and perception normal.        Mood and Affect: Mood and affect normal.        Speech: Speech normal.        Behavior: Behavior normal. Behavior is cooperative.        Thought Content: Thought content normal.        Cognition and Memory: Cognition and memory normal.        Judgment: Judgment normal.     Assessment  Plan  Diabetic eye exam ([place]) - Plan: Ambulatory referral to Ophthalmology  Type 2 diabetes mellitus without complication, without long-term current use of insulin ([place]) - Plan: metFORMIN (GLUCOPHAGE) 500 MG  tablet, Hemoglobin A1c, Urinalysis, Routine w reflex microscopic, Microalbumin / creatinine urine ratio Cont meds  Foot exam at f/u   Type 2 diabetes mellitus with hyperglycemia, without long-term current  use of insulin ([place]) - Plan: sitaGLIPtin (JANUVIA) 25 MG tablet, Hemoglobin A1c  Hypertension associated with diabetes ([place]) - Plan: Comprehensive metabolic panel, Lipid panel, CBC with Differential/Platelet, Hemoglobin A1c BP improved   HM Had flu shotutd 2020 Tdaputd covid 2/2  pna 23 vaccine given today Disc shingrix in future Protectedhep B, MMRimmune declines std check  PSA nl and 7/20200.65 consider DRE in future for 2020 colonoscopysch 8/30/19normaltortuous colon -f/u in 5 yearsper GI ?M. Uncle colon cancer No need for dermatology referral nowconsider in future Used to chew chewing tobacco  rec healthy diet and exercise disc The next 56 days lifestyle changes  Provider: Dr. Olivia Mackie McLean-Scocuzza-Internal Medicine

## 2020-04-20 NOTE — Patient Instructions (Addendum)
Voltaren gel over the counter 4x per day   Call [facility] eye for eye exam     [facility] Ask your health care provider which exercises are safe for you. Do exercises exactly as told by your health care provider and adjust them as directed. It is normal to feel mild stretching, pulling, tightness, or discomfort as you do these exercises. Stop right away if you feel sudden pain or your pain gets worse. Do not begin these exercises until told by your health care provider. Stretching and range-of-motion exercises These exercises warm up your muscles and joints and improve the movement and flexibility of your elbow. These exercises also help to relieve pain, numbness, and tingling. Wrist flexion, assisted  1. Straighten your left / right elbow in front of you with your palm facing down toward the floor. ? If told by your health care provider, bend your left / right elbow to a 90-degree angle (right angle) at your side. 2. With your other hand, gently push over the back of your left / right hand so your fingers point toward the floor (flexion). Stop when you feel a gentle stretch on the back of your forearm. 3. Hold this position for __________ seconds. Repeat __________ times. Complete this exercise __________ times a day. Wrist extension, assisted  1. Straighten your left / right elbow in front of you with your palm facing up toward the ceiling. ? If told by your health care provider, bend your left / right elbow to a 90-degree angle (right angle) at your side. 2. With your other hand, gently pull your left / right hand and fingers toward the floor (extension). Stop when you feel a gentle stretch on the palm side of your forearm. 3. Hold this position for __________ seconds. Repeat __________ times. Complete this exercise __________ times a day. Assisted forearm rotation, supination 1. Sit or stand with your left / right elbow bent to a 90-degree angle (right angle) at your  side. 2. Using your uninjured hand, turn (rotate) your left / right palm up toward the ceiling (supination) until you feel a gentle stretch along the inside of your forearm. 3. Hold this position for __________ seconds. Repeat __________ times. Complete this exercise __________ times a day. Assisted forearm rotation, pronation 1. Sit or stand with your left / right elbow bent to a 90-degree angle (right angle) at your side. 2. Using your uninjured hand, rotate your left / right palm down toward the floor (pronation) until you feel a gentle stretch along the outside of your forearm. 3. Hold this position for __________ seconds. Repeat __________ times. Complete this exercise __________ times a day. Strengthening exercises These exercises build strength and endurance in your forearm and elbow. Endurance is the ability to use your muscles for a long time, even after they get tired. Radial deviation  1. Stand with a __________ weight or a hammer in your left / right hand. Or, sit while holding a rubber exercise band or tubing, with your left / right forearm supported on a table or countertop. ? If you are standing, position your forearm so that your thumb is facing forward. If you are sitting, position your forearm so that the thumb is facing the ceiling. This is the neutral position. 2. Raise your hand upward in front of you so your thumb moves toward the ceiling (radial deviation), or pull up on the rubber tubing. Keep your forearm and elbow still while you move your wrist only. 3. Hold  this position for __________ seconds. 4. Slowly return to the starting position. Repeat __________ times. Complete this exercise __________ times a day. Wrist extension, eccentric 1. Sit with your left / right forearm palm-down and supported on a table or other surface. Let your left / right wrist extend over the edge of the surface. 2. Hold a __________ weight or a piece of exercise band or tubing in your left /  right hand. ? If using a rubber exercise band or tubing, hold the other end of the tubing with your other hand. 3. Use your uninjured hand to move your left / right hand up toward the ceiling. 4. Take your uninjured hand away and slowly return to the starting position using only your left / right hand. Lowering your arm under tension is called eccentric extension. Repeat __________ times. Complete this exercise __________ times a day. Wrist extension Do not do this exercise if it causes pain at the outside of your elbow. Only do this exercise once instructed by your health care provider. 1. Sit with your left / right forearm supported on a table or other surface and your palm turned down toward the floor. Let your left / right wrist extend over the edge of the surface. 2. Hold a __________ weight or a piece of rubber exercise band or tubing. ? If you are using a rubber exercise band or tubing, hold the band or tubing in place with your other hand to provide resistance. 3. Slowly bend your wrist so your hand moves up toward the ceiling (extension). Move only your wrist, keeping your forearm and elbow still. 4. Hold this position for __________ seconds. 5. Slowly return to the starting position. Repeat __________ times. Complete this exercise __________ times a day. Forearm rotation, supination To do this exercise, you will need a lightweight hammer or rubber mallet. 1. Sit with your left / right forearm supported on a table or other surface. Bend your elbow to a 90-degree angle (right angle). Position your forearm so that your palm is facing down toward the floor, with your hand resting over the edge of the table. 2. Hold a hammer in your left / right hand. ? To make this exercise easier, hold the hammer near the head of the hammer. ? To make this exercise harder, hold the hammer near the end of the handle. 3. Without moving your wrist or elbow, slowly rotate your forearm so your palm faces up  toward the ceiling (supination). 4. Hold this position for __________ seconds. 5. Slowly return to the starting position. Repeat __________ times. Complete this exercise __________ times a day. Shoulder blade squeeze 1. Sit in a stable chair or stand with good posture. If you are sitting down, do not let your back touch the back of the chair. 2. Your arms should be at your sides with your elbows bent to a 90-degree angle (right angle). Position your forearms so that your thumbs are facing the ceiling (neutral position). 3. Without lifting your shoulders up, squeeze your shoulder blades tightly together. 4. Hold this position for __________ seconds. 5. Slowly release and return to the starting position. Repeat __________ times. Complete this exercise __________ times a day. This information is not intended to replace advice given to you by your health care provider. Make sure you discuss any questions you have with your health care provider. Document Revised: 01/27/2019 Document Reviewed: 11/30/2018 Elsevier Patient Education  2020 [employer].  Journal for Nurse Practitioners, 15(4), [phone]. Retrieved July 26, 2018 from http://clinicalkey.com/nursing">  Knee Exercises Ask your health care provider which exercises are safe for you. Do exercises exactly as told by your health care provider and adjust them as directed. It is normal to feel mild stretching, pulling, tightness, or discomfort as you do these exercises. Stop right away if you feel sudden pain or your pain gets worse. Do not begin these exercises until told by your health care provider. Stretching and range-of-motion exercises These exercises warm up your muscles and joints and improve the movement and flexibility of your knee. These exercises also help to relieve pain and swelling. Knee extension, prone 4. Lie on your abdomen (prone position) on a bed. 5. Place your left / right knee just beyond the edge of the surface so your  knee is not on the bed. You can put a towel under your left / right thigh just above your kneecap for comfort. 6. Relax your leg muscles and allow gravity to straighten your knee (extension). You should feel a stretch behind your left / right knee. 7. Hold this position for __________ seconds. 8. Scoot up so your knee is supported between repetitions. Repeat __________ times. Complete this exercise __________ times a day. Knee flexion, active  4. Lie on your back with both legs straight. If this causes back discomfort, bend your left / right knee so your foot is flat on the floor. 5. Slowly slide your left / right heel back toward your buttocks. Stop when you feel a gentle stretch in the front of your knee or thigh (flexion). 6. Hold this position for __________ seconds. 7. Slowly slide your left / right heel back to the starting position. Repeat __________ times. Complete this exercise __________ times a day. Quadriceps stretch, prone  4. Lie on your abdomen on a firm surface, such as a bed or padded floor. 5. Bend your left / right knee and hold your ankle. If you cannot reach your ankle or pant leg, loop a belt around your foot and grab the belt instead. 6. Gently pull your heel toward your buttocks. Your knee should not slide out to the side. You should feel a stretch in the front of your thigh and knee (quadriceps). 7. Hold this position for __________ seconds. Repeat __________ times. Complete this exercise __________ times a day. Hamstring, supine 4. Lie on your back (supine position). 5. Loop a belt or towel over the ball of your left / right foot. The ball of your foot is on the walking surface, right under your toes. 6. Straighten your left / right knee and slowly pull on the belt to raise your leg until you feel a gentle stretch behind your knee (hamstring). ? Do not let your knee bend while you do this. ? Keep your other leg flat on the floor. 7. Hold this position for __________  seconds. Repeat __________ times. Complete this exercise __________ times a day. Strengthening exercises These exercises build strength and endurance in your knee. Endurance is the ability to use your muscles for a long time, even after they get tired. Quadriceps, isometric This exercise stretches the muscles in front of your thigh (quadriceps) without moving your knee joint (isometric). 5. Lie on your back with your left / right leg extended and your other knee bent. Put a rolled towel or small pillow under your knee if told by your health care provider. 6. Slowly tense the muscles in the front of your left / right thigh. You should see your kneecap slide  up toward your hip or see increased dimpling just above the knee. This motion will push the back of the knee toward the floor. 7. For __________ seconds, hold the muscle as tight as you can without increasing your pain. 8. Relax the muscles slowly and completely. Repeat __________ times. Complete this exercise __________ times a day. Straight leg raises This exercise stretches the muscles in front of your thigh (quadriceps) and the muscles that move your hips (hip flexors). 5. Lie on your back with your left / right leg extended and your other knee bent. 6. Tense the muscles in the front of your left / right thigh. You should see your kneecap slide up or see increased dimpling just above the knee. Your thigh may even shake a bit. 7. Keep these muscles tight as you raise your leg 4-6 inches (10-15 cm) off the floor. Do not let your knee bend. 8. Hold this position for __________ seconds. 9. Keep these muscles tense as you lower your leg. 10. Relax your muscles slowly and completely after each repetition. Repeat __________ times. Complete this exercise __________ times a day. Hamstring, isometric 6. Lie on your back on a firm surface. 7. Bend your left / right knee about __________ degrees. 8. Dig your left / right heel into the surface as if  you are trying to pull it toward your buttocks. Tighten the muscles in the back of your thighs (hamstring) to "dig" as hard as you can without increasing any pain. 9. Hold this position for __________ seconds. 10. Release the tension gradually and allow your muscles to relax completely for __________ seconds after each repetition. Repeat __________ times. Complete this exercise __________ times a day. Hamstring curls If told by your health care provider, do this exercise while wearing ankle weights. Begin with __________ lb weights. Then increase the weight by 1 lb (0.5 kg) increments. Do not wear ankle weights that are more than __________ lb. 6. Lie on your abdomen with your legs straight. 7. Bend your left / right knee as far as you can without feeling pain. Keep your hips flat against the floor. 8. Hold this position for __________ seconds. 9. Slowly lower your leg to the starting position. Repeat __________ times. Complete this exercise __________ times a day. Squats This exercise strengthens the muscles in front of your thigh and knee (quadriceps). 6. Stand in front of a table, with your feet and knees pointing straight ahead. You may rest your hands on the table for balance but not for support. 7. Slowly bend your knees and lower your hips like you are going to sit in a chair. ? Keep your weight over your heels, not over your toes. ? Keep your lower legs upright so they are parallel with the table legs. ? Do not let your hips go lower than your knees. ? Do not bend lower than told by your health care provider. ? If your knee pain increases, do not bend as low. 8. Hold the squat position for __________ seconds. 9. Slowly push with your legs to return to standing. Do not use your hands to pull yourself to standing. Repeat __________ times. Complete this exercise __________ times a day. Wall slides This exercise strengthens the muscles in front of your thigh and knee  (quadriceps). 1. Lean your back against a smooth wall or door, and walk your feet out 18-24 inches (46-61 cm) from it. 2. Place your feet hip-width apart. 3. Slowly slide down the wall or door until  your knees bend __________ degrees. Keep your knees over your heels, not over your toes. Keep your knees in line with your hips. 4. Hold this position for __________ seconds. Repeat __________ times. Complete this exercise __________ times a day. Straight leg raises This exercise strengthens the muscles that rotate the leg at the hip and move it away from your body (hip abductors). 1. Lie on your side with your left / right leg in the top position. Lie so your head, shoulder, knee, and hip line up. You may bend your bottom knee to help you keep your balance. 2. Roll your hips slightly forward so your hips are stacked directly over each other and your left / right knee is facing forward. 3. Leading with your heel, lift your top leg 4-6 inches (10-15 cm). You should feel the muscles in your outer hip lifting. ? Do not let your foot drift forward. ? Do not let your knee roll toward the ceiling. 4. Hold this position for __________ seconds. 5. Slowly return your leg to the starting position. 6. Let your muscles relax completely after each repetition. Repeat __________ times. Complete this exercise __________ times a day. Straight leg raises This exercise stretches the muscles that move your hips away from the front of the pelvis (hip extensors). 1. Lie on your abdomen on a firm surface. You can put a pillow under your hips if that is more comfortable. 2. Tense the muscles in your buttocks and lift your left / right leg about 4-6 inches (10-15 cm). Keep your knee straight as you lift your leg. 3. Hold this position for __________ seconds. 4. Slowly lower your leg to the starting position. 5. Let your leg relax completely after each repetition. Repeat __________ times. Complete this exercise __________  times a day. This information is not intended to replace advice given to you by your health care provider. Make sure you discuss any questions you have with your health care provider. Document Revised: 07/27/2018 Document Reviewed: 07/27/2018 Elsevier Patient Education  2020 [employer]  Hand Exercises Hand exercises can be helpful for almost anyone. These exercises can strengthen the hands, improve flexibility and movement, and increase blood flow to the hands. These results can make work and daily tasks easier. Hand exercises can be especially helpful for people who have joint pain from arthritis or have nerve damage from overuse (carpal tunnel syndrome). These exercises can also help people who have injured a hand. Exercises Most of these hand exercises are gentle stretching and motion exercises. It is usually safe to do them often throughout the day. Warming up your hands before exercise may help to reduce stiffness. You can do this with gentle massage or by placing your hands in warm water for 10-15 minutes. It is normal to feel some stretching, pulling, tightness, or mild discomfort as you begin new exercises. This will gradually improve. Stop an exercise right away if you feel sudden, severe pain or your pain gets worse. Ask your health care provider which exercises are best for you. Knuckle bend or "claw" fist 1. Stand or sit with your arm, hand, and all five fingers pointed straight up. Make sure to keep your wrist straight during the exercise. 2. Gently bend your fingers down toward your palm until the tips of your fingers are touching the top of your palm. Keep your big knuckle straight and just bend the small knuckles in your fingers. 3. Hold this position for __________ seconds. 4. Straighten (extend) your fingers back  to the starting position. Repeat this exercise 5-10 times with each hand. Full finger fist 1. Stand or sit with your arm, hand, and all five fingers pointed straight  up. Make sure to keep your wrist straight during the exercise. 2. Gently bend your fingers into your palm until the tips of your fingers are touching the middle of your palm. 3. Hold this position for __________ seconds. 4. Extend your fingers back to the starting position, stretching every joint fully. Repeat this exercise 5-10 times with each hand. Straight fist 1. Stand or sit with your arm, hand, and all five fingers pointed straight up. Make sure to keep your wrist straight during the exercise. 2. Gently bend your fingers at the big knuckle, where your fingers meet your hand, and the middle knuckle. Keep the knuckle at the tips of your fingers straight and try to touch the bottom of your palm. 3. Hold this position for __________ seconds. 4. Extend your fingers back to the starting position, stretching every joint fully. Repeat this exercise 5-10 times with each hand. Tabletop 1. Stand or sit with your arm, hand, and all five fingers pointed straight up. Make sure to keep your wrist straight during the exercise. 2. Gently bend your fingers at the big knuckle, where your fingers meet your hand, as far down as you can while keeping the small knuckles in your fingers straight. Think of forming a tabletop with your fingers. 3. Hold this position for __________ seconds. 4. Extend your fingers back to the starting position, stretching every joint fully. Repeat this exercise 5-10 times with each hand. Finger spread 1. Place your hand flat on a table with your palm facing down. Make sure your wrist stays straight as you do this exercise. 2. Spread your fingers and thumb apart from each other as far as you can until you feel a gentle stretch. Hold this position for __________ seconds. 3. Bring your fingers and thumb tight together again. Hold this position for __________ seconds. Repeat this exercise 5-10 times with each hand. Making circles 1. Stand or sit with your arm, hand, and all five  fingers pointed straight up. Make sure to keep your wrist straight during the exercise. 2. Make a circle by touching the tip of your thumb to the tip of your index finger. 3. Hold for __________ seconds. Then open your hand wide. 4. Repeat this motion with your thumb and each finger on your hand. Repeat this exercise 5-10 times with each hand. Thumb motion 1. Sit with your forearm resting on a table and your wrist straight. Your thumb should be facing up toward the ceiling. Keep your fingers relaxed as you move your thumb. 2. Lift your thumb up as high as you can toward the ceiling. Hold for __________ seconds. 3. Bend your thumb across your palm as far as you can, reaching the tip of your thumb for the small finger (pinkie) side of your palm. Hold for __________ seconds. Repeat this exercise 5-10 times with each hand. Grip strengthening  1. Hold a stress ball or other soft ball in the middle of your hand. 2. Slowly increase the pressure, squeezing the ball as much as you can without causing pain. Think of bringing the tips of your fingers into the middle of your palm. All of your finger joints should bend when doing this exercise. 3. Hold your squeeze for __________ seconds, then relax. Repeat this exercise 5-10 times with each hand. Contact a health care provider if:  Your hand pain or discomfort gets much worse when you do an exercise.  Your hand pain or discomfort does not improve within 2 hours after you exercise. If you have any of these problems, stop doing these exercises right away. Do not do them again unless your health care provider says that you can. Get help right away if:  You develop sudden, severe hand pain or swelling. If this happens, stop doing these exercises right away. Do not do them again unless your health care provider says that you can. This information is not intended to replace advice given to you by your health care provider. Make sure you discuss any questions  you have with your health care provider. Document Revised: 01/27/2019 Document Reviewed: 10/07/2018 Elsevier Patient Education  2020 [employer]  DASH Eating Plan DASH stands for "Dietary Approaches to Stop Hypertension." The DASH eating plan is a healthy eating plan that has been shown to reduce high blood pressure (hypertension). It may also reduce your risk for type 2 diabetes, heart disease, and stroke. The DASH eating plan may also help with weight loss. What are tips for following this plan?  General guidelines  Avoid eating more than 2,300 mg (milligrams) of salt (sodium) a day. If you have hypertension, you may need to reduce your sodium intake to 1,500 mg a day.  Limit alcohol intake to no more than 1 drink a day for nonpregnant women and 2 drinks a day for men. One drink equals 12 oz of beer, 5 oz of wine, or 1 oz of hard liquor.  Work with your health care provider to maintain a healthy body weight or to lose weight. Ask what an ideal weight is for you.  Get at least 30 minutes of exercise that causes your heart to beat faster (aerobic exercise) most days of the week. Activities may include walking, swimming, or biking.  Work with your health care provider or diet and nutrition specialist (dietitian) to adjust your eating plan to your individual calorie needs. Reading food labels   Check food labels for the amount of sodium per serving. Choose foods with less than 5 percent of the Daily Value of sodium. Generally, foods with less than 300 mg of sodium per serving fit into this eating plan.  To find whole grains, look for the word "whole" as the first word in the ingredient list. Shopping  Buy products labeled as "low-sodium" or "no salt added."  Buy fresh foods. Avoid canned foods and premade or frozen meals. Cooking  Avoid adding salt when cooking. Use salt-free seasonings or herbs instead of table salt or sea salt. Check with your health care provider or pharmacist  before using salt substitutes.  Do not fry foods. Cook foods using healthy methods such as baking, boiling, grilling, and broiling instead.  Cook with heart-healthy oils, such as olive, canola, soybean, or sunflower oil. Meal planning  Eat a balanced diet that includes: ? 5 or more servings of fruits and vegetables each day. At each meal, try to fill half of your plate with fruits and vegetables. ? Up to 6-8 servings of whole grains each day. ? Less than 6 oz of lean meat, poultry, or fish each day. A 3-oz serving of meat is about the same size as a deck of cards. One egg equals 1 oz. ? 2 servings of low-fat dairy each day. ? A serving of nuts, seeds, or beans 5 times each week. ? Heart-healthy fats. Healthy fats called Omega-3 fatty acids  are found in foods such as flaxseeds and coldwater fish, like sardines, salmon, and mackerel.  Limit how much you eat of the following: ? Canned or prepackaged foods. ? Food that is high in trans fat, such as fried foods. ? Food that is high in saturated fat, such as fatty meat. ? Sweets, desserts, sugary drinks, and other foods with added sugar. ? Full-fat dairy products.  Do not salt foods before eating.  Try to eat at least 2 vegetarian meals each week.  Eat more home-cooked food and less restaurant, buffet, and fast food.  When eating at a restaurant, ask that your food be prepared with less salt or no salt, if possible. What foods are recommended? The items listed may not be a complete list. Talk with your dietitian about what dietary choices are best for you. Grains Whole-grain or whole-wheat bread. Whole-grain or whole-wheat pasta. Brown rice. Orpah Cobb. Bulgur. Whole-grain and low-sodium cereals. Pita bread. Low-fat, low-sodium crackers. Whole-wheat flour tortillas. Vegetables Fresh or frozen vegetables (raw, steamed, roasted, or grilled). Low-sodium or reduced-sodium tomato and vegetable juice. Low-sodium or reduced-sodium tomato  sauce and tomato paste. Low-sodium or reduced-sodium canned vegetables. Fruits All fresh, dried, or frozen fruit. Canned fruit in natural juice (without added sugar). Meat and other protein foods Skinless chicken or [place]. Ground chicken or [place]. Pork with fat trimmed off. Fish and seafood. Egg whites. Dried beans, peas, or lentils. Unsalted nuts, nut butters, and seeds. Unsalted canned beans. Lean cuts of beef with fat trimmed off. Low-sodium, lean deli meat. Dairy Low-fat (1%) or fat-free (skim) milk. Fat-free, low-fat, or reduced-fat cheeses. Nonfat, low-sodium ricotta or cottage cheese. Low-fat or nonfat yogurt. Low-fat, low-sodium cheese. Fats and oils Soft margarine without trans fats. Vegetable oil. Low-fat, reduced-fat, or light mayonnaise and salad dressings (reduced-sodium). Canola, safflower, olive, soybean, and sunflower oils. Avocado. Seasoning and other foods Herbs. Spices. Seasoning mixes without salt. Unsalted popcorn and pretzels. Fat-free sweets. What foods are not recommended? The items listed may not be a complete list. Talk with your dietitian about what dietary choices are best for you. Grains Baked goods made with fat, such as croissants, muffins, or some breads. Dry pasta or rice meal packs. Vegetables Creamed or fried vegetables. Vegetables in a cheese sauce. Regular canned vegetables (not low-sodium or reduced-sodium). Regular canned tomato sauce and paste (not low-sodium or reduced-sodium). Regular tomato and vegetable juice (not low-sodium or reduced-sodium). Rosita Fire. Olives. Fruits Canned fruit in a light or heavy syrup. Fried fruit. Fruit in cream or butter sauce. Meat and other protein foods Fatty cuts of meat. Ribs. Fried meat. Tomasa Blase. Sausage. Bologna and other processed lunch meats. Salami. Fatback. Hotdogs. Bratwurst. Salted nuts and seeds. Canned beans with added salt. Canned or smoked fish. Whole eggs or egg yolks. Chicken or [place] with skin. Dairy Whole  or 2% milk, cream, and half-and-half. Whole or full-fat cream cheese. Whole-fat or sweetened yogurt. Full-fat cheese. Nondairy creamers. Whipped toppings. Processed cheese and cheese spreads. Fats and oils Butter. Stick margarine. Lard. Shortening. Ghee. Bacon fat. Tropical oils, such as coconut, palm kernel, or palm oil. Seasoning and other foods Salted popcorn and pretzels. Onion salt, garlic salt, seasoned salt, table salt, and sea salt. Worcestershire sauce. Tartar sauce. Barbecue sauce. Teriyaki sauce. Soy sauce, including reduced-sodium. Steak sauce. Canned and packaged gravies. Fish sauce. Oyster sauce. Cocktail sauce. Horseradish that you find on the shelf. Ketchup. Mustard. Meat flavorings and tenderizers. Bouillon cubes. Hot sauce and Tabasco sauce. Premade or packaged marinades. Premade or packaged taco  seasonings. Relishes. Regular salad dressings. Where to find more information:  National Heart, Lung, and Blood Institute: PopSteam.is  American Heart Association: www.heart.org Summary  The DASH eating plan is a healthy eating plan that has been shown to reduce high blood pressure (hypertension). It may also reduce your risk for type 2 diabetes, heart disease, and stroke.  With the DASH eating plan, you should limit salt (sodium) intake to 2,300 mg a day. If you have hypertension, you may need to reduce your sodium intake to 1,500 mg a day.  When on the DASH eating plan, aim to eat more fresh fruits and vegetables, whole grains, lean proteins, low-fat dairy, and heart-healthy fats.  Work with your health care provider or diet and nutrition specialist (dietitian) to adjust your eating plan to your individual calorie needs. This information is not intended to replace advice given to you by your health care provider. Make sure you discuss any questions you have with your health care provider. Document Revised: 09/18/2017 Document Reviewed: 09/29/2016 Elsevier Patient Education   2020 [employer].  Budget-Friendly Healthy Eating There are many ways to save money at the grocery store and continue to eat healthy. You can be successful if you:  Plan meals according to your budget.  Make a grocery list and only purchase food according to your grocery list.  Prepare food yourself. What are tips for following this plan?  Reading food labels  Compare food labels between brand name foods and the store brand. Often the nutritional value is the same, but the store brand is lower cost.  Look for products that do not have added sugar, fat, or salt (sodium). These often cost the same but are healthier for you. Products may be labeled as: ? Sugar-free. ? Nonfat. ? Low-fat. ? Sodium-free. ? Low-sodium.  Look for lean ground beef labeled as at least 92% lean and 8% fat. Shopping  Buy only the items on your grocery list and go only to the areas of the store that have the items on your list.  Use coupons only for foods and brands you normally buy. Avoid buying items you wouldn't normally buy simply because they are on sale.  Check online and in newspapers for weekly deals.  Buy healthy items from the bulk bins when available, such as herbs, spices, flour, pasta, nuts, and dried fruit.  Buy fruits and vegetables that are in season. Prices are usually lower on in-season produce.  Look at the unit price on the price tag. Use it to compare different brands and sizes to find out which item is the best deal.  Choose healthy items that are often low-cost, such as carrots, potatoes, apples, bananas, and oranges. Dried or canned beans are a low-cost protein source.  Buy in bulk and freeze extra food. Items you can buy in bulk include meats, fish, poultry, frozen fruits, and frozen vegetables.  Avoid buying "ready-to-eat" foods, such as pre-cut fruits and vegetables and pre-made salads.  If possible, shop around to discover where you can find the best prices. Consider other  retailers such as dollar stores, larger [employer], local fruit and vegetable stands, and farmers markets.  Do not shop when you are hungry. If you shop while hungry, it may be hard to stick to your list and budget.  Resist impulse buying. Use your grocery list as your official plan for the week.  Buy a variety of vegetables and fruits by purchasing fresh, frozen, and canned items.  Look at the top  and bottom shelves for deals. Foods at eye level (eye level of an adult or child) are usually more expensive.  Be efficient with your time when shopping. The more time you spend at the store, the more money you are likely to spend.  To save money when choosing more expensive foods like meats and dairy: ? Choose cheaper cuts of meat, such as bone-in chicken thighs and drumsticks instead of skinless and boneless chicken. When you are ready to prepare the chicken, you can remove the skin yourself to make it healthier. ? Choose lean meats like chicken or [place] instead of beef. ? Choose canned seafood, such as tuna, salmon, or sardines. ? Buy eggs as a low-cost source of protein. ? Buy dried beans and peas, such as lentils, split peas, or kidney beans instead of meats. Dried beans and peas are a good alternative source of protein. ? Buy the larger tubs of yogurt instead of individual-sized containers.  Choose water instead of sodas and other sweetened beverages.  Avoid buying chips, cookies, and other "junk food." These items are usually expensive and not healthy. Cooking  Make extra food and freeze the extras in meal-sized containers or in individual portions for fast meals and snacks.  Pre-cook on days when you have extra time to prepare meals in advance. You can keep these meals in the fridge or freezer and reheat for a quick meal.  When you come home from the grocery store, wash, peel, and cut fruits and vegetables so they are ready to use and eat. This will help reduce food waste. Meal  planning  Do not eat out or get fast food. Prepare food at home.  Make a grocery list and make sure to bring it with you to the store. If you have a smart phone, you could use your phone to create your shopping list.  Plan meals and snacks according to a grocery list and budget you create.  Use leftovers in your meal plan for the week.  Look for recipes where you can cook once and make enough food for two meals.  Include budget-friendly meals like stews, casseroles, and stir-fry dishes.  Try some meatless meals or try "no cook" meals like salads.  Make sure that half your plate is filled with fruits or vegetables. Choose from fresh, frozen, or canned fruits and vegetables. If eating canned, remember to rinse them before eating. This will remove any excess salt added for packaging. Summary  Eating healthy on a budget is possible if you plan your meals according to your budget, purchase according to your budget and grocery list, and prepare food yourself.  Tips for buying more food on a limited budget include buying generic brands, using coupons only for foods you normally buy, and buying healthy items from the bulk bins when available.  Tips for buying cheaper food to replace expensive food include choosing cheaper, lean cuts of meat, and buying dried beans and peas. This information is not intended to replace advice given to you by your health care provider. Make sure you discuss any questions you have with your health care provider. Document Revised: 10/07/2017 Document Reviewed: 10/07/2017 Elsevier Patient Education  2020 Elsevier Inc.  Diabetes Mellitus and Nutrition, Adult When you have diabetes (diabetes mellitus), it is very important to have healthy eating habits because your blood sugar (glucose) levels are greatly affected by what you eat and drink. Eating healthy foods in the appropriate amounts, at about the same times every day, can help  you:  Control your blood  glucose.  Lower your risk of heart disease.  Improve your blood pressure.  Reach or maintain a healthy weight. Every person with diabetes is different, and each person has different needs for a meal plan. Your health care provider may recommend that you work with a diet and nutrition specialist (dietitian) to make a meal plan that is best for you. Your meal plan may vary depending on factors such as:  The calories you need.  The medicines you take.  Your weight.  Your blood glucose, blood pressure, and cholesterol levels.  Your activity level.  Other health conditions you have, such as heart or kidney disease. How do carbohydrates affect me? Carbohydrates, also called carbs, affect your blood glucose level more than any other type of food. Eating carbs naturally raises the amount of glucose in your blood. Carb counting is a method for keeping track of how many carbs you eat. Counting carbs is important to keep your blood glucose at a healthy level, especially if you use insulin or take certain oral diabetes medicines. It is important to know how many carbs you can safely have in each meal. This is different for every person. Your dietitian can help you calculate how many carbs you should have at each meal and for each snack. Foods that contain carbs include:  Bread, cereal, rice, pasta, and crackers.  Potatoes and corn.  Peas, beans, and lentils.  Milk and yogurt.  Fruit and juice.  Desserts, such as cakes, cookies, ice cream, and candy. How does alcohol affect me? Alcohol can cause a sudden decrease in blood glucose (hypoglycemia), especially if you use insulin or take certain oral diabetes medicines. Hypoglycemia can be a life-threatening condition. Symptoms of hypoglycemia (sleepiness, dizziness, and confusion) are similar to symptoms of having too much alcohol. If your health care provider says that alcohol is safe for you, follow these guidelines:  Limit alcohol intake to  no more than 1 drink per day for nonpregnant women and 2 drinks per day for men. One drink equals 12 oz of beer, 5 oz of wine, or 1 oz of hard liquor.  Do not drink on an empty stomach.  Keep yourself hydrated with water, diet soda, or unsweetened iced tea.  Keep in mind that regular soda, juice, and other mixers may contain a lot of sugar and must be counted as carbs. What are tips for following this plan?  Reading food labels  Start by checking the serving size on the "Nutrition Facts" label of packaged foods and drinks. The amount of calories, carbs, fats, and other nutrients listed on the label is based on one serving of the item. Many items contain more than one serving per package.  Check the total grams (g) of carbs in one serving. You can calculate the number of servings of carbs in one serving by dividing the total carbs by 15. For example, if a food has 30 g of total carbs, it would be equal to 2 servings of carbs.  Check the number of grams (g) of saturated and trans fats in one serving. Choose foods that have low or no amount of these fats.  Check the number of milligrams (mg) of salt (sodium) in one serving. Most people should limit total sodium intake to less than 2,300 mg per day.  Always check the nutrition information of foods labeled as "low-fat" or "nonfat". These foods may be higher in added sugar or refined carbs and should be avoided.  Talk to your dietitian to identify your daily goals for nutrients listed on the label. Shopping  Avoid buying canned, premade, or processed foods. These foods tend to be high in fat, sodium, and added sugar.  Shop around the outside edge of the grocery store. This includes fresh fruits and vegetables, bulk grains, fresh meats, and fresh dairy. Cooking  Use low-heat cooking methods, such as baking, instead of high-heat cooking methods like deep frying.  Cook using healthy oils, such as olive, canola, or sunflower oil.  Avoid  cooking with butter, cream, or high-fat meats. Meal planning  Eat meals and snacks regularly, preferably at the same times every day. Avoid going long periods of time without eating.  Eat foods high in fiber, such as fresh fruits, vegetables, beans, and whole grains. Talk to your dietitian about how many servings of carbs you can eat at each meal.  Eat 4-6 ounces (oz) of lean protein each day, such as lean meat, chicken, fish, eggs, or tofu. One oz of lean protein is equal to: ? 1 oz of meat, chicken, or fish. ? 1 egg. ?  cup of tofu.  Eat some foods each day that contain healthy fats, such as avocado, nuts, seeds, and fish. Lifestyle  Check your blood glucose regularly.  Exercise regularly as told by your health care provider. This may include: ? 150 minutes of moderate-intensity or vigorous-intensity exercise each week. This could be brisk walking, biking, or water aerobics. ? Stretching and doing strength exercises, such as yoga or weightlifting, at least 2 times a week.  Take medicines as told by your health care provider.  Do not use any products that contain nicotine or tobacco, such as cigarettes and e-cigarettes. If you need help quitting, ask your health care provider.  Work with a Veterinary surgeon or diabetes educator to identify strategies to manage stress and any emotional and social challenges. Questions to ask a health care provider  Do I need to meet with a diabetes educator?  Do I need to meet with a dietitian?  What number can I call if I have questions?  When are the best times to check my blood glucose? Where to find more information:  American Diabetes Association: diabetes.org  Academy of Nutrition and Dietetics: www.eatright.[employer] of Diabetes and Digestive and Kidney Diseases (NIH): CarFlippers.tn Summary  A healthy meal plan will help you control your blood glucose and maintain a healthy lifestyle.  Working with a diet and nutrition  specialist (dietitian) can help you make a meal plan that is best for you.  Keep in mind that carbohydrates (carbs) and alcohol have immediate effects on your blood glucose levels. It is important to count carbs and to use alcohol carefully. This information is not intended to replace advice given to you by your health care provider. Make sure you discuss any questions you have with your health care provider. Document Revised: 09/18/2017 Document Reviewed: 11/10/2016 Elsevier Patient Education  2020 [employer].   Consider cholesterol medication  Pravastatin tablets What is this medicine? PRAVASTATIN (PRA va stat in) is known as a HMG-CoA reductase inhibitor or 'statin'. It lowers the level of cholesterol and triglycerides in the blood. This drug may also reduce the risk of heart attack, stroke, or other health problems in patients with risk factors for heart disease. Diet and lifestyle changes are often used with this drug. This medicine may be used for other purposes; ask your health care provider or pharmacist if you have questions. COMMON  BRAND NAME(S): Pravachol What should I tell my health care provider before I take this medicine? They need to know if you have any of these conditions:  diabetes  if you often drink alcohol  history of stroke  kidney disease  liver disease  muscle aches or weakness  thyroid disease  an unusual or allergic reaction to pravastatin, other medicines, foods, dyes, or preservatives  pregnant or trying to get pregnant  breast-feeding How should I use this medicine? Take pravastatin tablets by mouth. Swallow the tablets with a drink of water. Pravastatin can be taken at anytime of the day, with or without food. Follow the directions on the prescription label. Take your doses at regular intervals. Do not take your medicine more often than directed. Talk to your pediatrician regarding the use of this medicine in children. Special care may be  needed. Pravastatin has been used in children as young as 36 years of age. Overdosage: If you think you have taken too much of this medicine contact a poison control center or emergency room at once. NOTE: This medicine is only for you. Do not share this medicine with others. What if I miss a dose? If you miss a dose, take it as soon as you can. If it is almost time for your next dose, take only that dose. Do not take double or extra doses. What may interact with this medicine? This medicine may interact with the following medications:  colchicine  cyclosporine  other medicines for high cholesterol  some antibiotics like azithromycin, clarithromycin, erythromycin, and telithromycin This list may not describe all possible interactions. Give your health care provider a list of all the medicines, herbs, non-prescription drugs, or dietary supplements you use. Also tell them if you smoke, drink alcohol, or use illegal drugs. Some items may interact with your medicine. What should I watch for while using this medicine? Visit your doctor or health care professional for regular check-ups. You may need regular tests to make sure your liver is working properly. Your health care professional may tell you to stop taking this medicine if you develop muscle problems. If your muscle problems do not go away after stopping this medicine, contact your health care professional. Do not become pregnant while taking this medicine. Women should inform their health care professional if they wish to become pregnant or think they might be pregnant. There is a potential for serious side effects to an unborn child. Talk to your health care professional or pharmacist for more information. Do not breast-feed an infant while taking this medicine. This medicine may affect blood sugar levels. If you have diabetes, check with your doctor or health care professional before you change your diet or the dose of your diabetic  medicine. If you are going to need surgery or other procedure, tell your doctor that you are using this medicine. This drug is only part of a total heart-health program. Your doctor or a dietician can suggest a low-cholesterol and low-fat diet to help. Avoid alcohol and smoking, and keep a proper exercise schedule. This medicine may cause a decrease in Co-Enzyme Q-10. You should make sure that you get enough Co-Enzyme Q-10 while you are taking this medicine. Discuss the foods you eat and the vitamins you take with your health care professional. What side effects may I notice from receiving this medicine? Side effects that you should report to your doctor or health care professional as soon as possible:  allergic reactions like skin rash, itching or hives,  swelling of the face, lips, or tongue  dark urine  fever  muscle pain, cramps, or weakness  redness, blistering, peeling or loosening of the skin, including inside the mouth  trouble passing urine or change in the amount of urine  unusually weak or tired  yellowing of the eyes or skin Side effects that usually do not require medical attention (report to your doctor or health care professional if they continue or are bothersome):  gas  headache  heartburn  indigestion  stomach pain This list may not describe all possible side effects. Call your doctor for medical advice about side effects. You may report side effects to FDA at 1-800-FDA-1088. Where should I keep my medicine? Keep out of the reach of children. Store at room temperature between 15 to 30 degrees C (59 to 86 degrees F). Protect from light. Keep container tightly closed. Throw away any unused medicine after the expiration date. NOTE: This sheet is a summary. It may not cover all possible information. If you have questions about this medicine, talk to your doctor, pharmacist, or health care provider.  2020 Elsevier/Gold Standard (2017-06-09 12:37:09)

## 2020-04-20 NOTE — Telephone Encounter (Signed)
Faxed Response Request to close potential gap in therapy to [facility] on 04-20-20

## 2020-04-21 LAB — URINALYSIS, ROUTINE W REFLEX MICROSCOPIC
Bilirubin Urine: NEGATIVE
Glucose, UA: NEGATIVE
Hgb urine dipstick: NEGATIVE
Ketones, ur: NEGATIVE
Leukocytes,Ua: NEGATIVE
Nitrite: NEGATIVE
Protein, ur: NEGATIVE
Specific Gravity, Urine: 1.009 (ref 1.001–1.03)
pH: 5.5 (ref 5.0–8.0)

## 2020-04-21 LAB — MICROALBUMIN / CREATININE URINE RATIO
Creatinine, Urine: 57 mg/dL (ref 20–320)
Microalb Creat Ratio: 4 mcg/mg creat (ref <30)
Microalb, Ur: 0.2 mg/dL

## 2020-04-30 ENCOUNTER — Other Ambulatory Visit: Payer: Self-pay | Admitting: Internal Medicine

## 2020-04-30 DIAGNOSIS — E119 Type 2 diabetes mellitus without complications: Secondary | ICD-10-CM

## 2020-04-30 DIAGNOSIS — I10 Essential (primary) hypertension: Secondary | ICD-10-CM

## 2020-04-30 MED ORDER — PRAVASTATIN SODIUM 10 MG PO TABS: 10.0000 mg | ORAL_TABLET | Freq: Every day | ORAL | 3 refills | Status: DC

## 2020-05-11 LAB — HM DIABETES EYE EXAM

## 2020-05-13 ENCOUNTER — Encounter: Payer: Self-pay | Admitting: Internal Medicine

## 2020-07-19 ENCOUNTER — Other Ambulatory Visit: Payer: Self-pay

## 2020-07-19 DIAGNOSIS — I10 Essential (primary) hypertension: Secondary | ICD-10-CM

## 2020-07-19 MED ORDER — HYDROCHLOROTHIAZIDE 25 MG PO TABS: 25.0000 mg | ORAL_TABLET | Freq: Every day | ORAL | 1 refills | Status: DC

## 2020-07-23 ENCOUNTER — Other Ambulatory Visit: Payer: Self-pay | Admitting: Internal Medicine

## 2020-07-23 DIAGNOSIS — I10 Essential (primary) hypertension: Secondary | ICD-10-CM

## 2020-07-23 MED ORDER — HYDROCHLOROTHIAZIDE 25 MG PO TABS: 25.0000 mg | ORAL_TABLET | Freq: Every day | ORAL | 3 refills | Status: DC

## 2020-10-26 ENCOUNTER — Ambulatory Visit: Payer: Managed Care, Other (non HMO) | Admitting: Internal Medicine

## 2020-10-26 ENCOUNTER — Ambulatory Visit: Payer: Managed Care, Other (non HMO)

## 2020-11-02 ENCOUNTER — Other Ambulatory Visit: Payer: Self-pay | Admitting: Internal Medicine

## 2020-11-02 ENCOUNTER — Encounter: Payer: Self-pay | Admitting: Internal Medicine

## 2020-11-02 ENCOUNTER — Ambulatory Visit: Payer: Managed Care, Other (non HMO)

## 2020-11-02 ENCOUNTER — Other Ambulatory Visit: Payer: Self-pay

## 2020-11-02 ENCOUNTER — Ambulatory Visit (INDEPENDENT_AMBULATORY_CARE_PROVIDER_SITE_OTHER): Payer: Managed Care, Other (non HMO) | Admitting: Internal Medicine

## 2020-11-02 VITALS — BP 136/80 | HR 93 | Temp 97.8°F | Ht 70.0 in | Wt 332.2 lb

## 2020-11-02 DIAGNOSIS — Z0000 Encounter for general adult medical examination without abnormal findings: Secondary | ICD-10-CM

## 2020-11-02 DIAGNOSIS — E1159 Type 2 diabetes mellitus with other circulatory complications: Secondary | ICD-10-CM

## 2020-11-02 DIAGNOSIS — I152 Hypertension secondary to endocrine disorders: Secondary | ICD-10-CM

## 2020-11-02 DIAGNOSIS — Z6841 Body Mass Index (BMI) 40.0 and over, adult: Secondary | ICD-10-CM

## 2020-11-02 DIAGNOSIS — I10 Essential (primary) hypertension: Secondary | ICD-10-CM

## 2020-11-02 LAB — COMPREHENSIVE METABOLIC PANEL
ALT: 26 U/L (ref 0–53)
AST: 19 U/L (ref 0–37)
Albumin: 4.4 g/dL (ref 3.5–5.2)
Alkaline Phosphatase: 104 U/L (ref 39–117)
BUN: 20 mg/dL (ref 6–23)
CO2: 25 mEq/L (ref 19–32)
Calcium: 9.3 mg/dL (ref 8.4–10.5)
Chloride: 101 mEq/L (ref 96–112)
Creatinine, Ser: 1.17 mg/dL (ref 0.40–1.50)
GFR: 70.73 mL/min (ref >60.00)
Glucose, Bld: 152 mg/dL — ABNORMAL HIGH (ref 70–99)
Potassium: 3.8 mEq/L (ref 3.5–5.1)
Sodium: 136 mEq/L (ref 135–145)
Total Bilirubin: 0.6 mg/dL (ref 0.2–1.2)
Total Protein: 7.4 g/dL (ref 6.0–8.3)

## 2020-11-02 LAB — CBC WITH DIFFERENTIAL/PLATELET
Basophils Absolute: 0 10*3/uL (ref 0.0–0.1)
Basophils Relative: 0.5 % (ref 0.0–3.0)
Eosinophils Absolute: 0.2 10*3/uL (ref 0.0–0.7)
Eosinophils Relative: 2.5 % (ref 0.0–5.0)
HCT: 44.6 % (ref 39.0–52.0)
Hemoglobin: 15.2 g/dL (ref 13.0–17.0)
Lymphocytes Relative: 28.2 % (ref 12.0–46.0)
Lymphs Abs: 2.3 10*3/uL (ref 0.7–4.0)
MCHC: 34 g/dL (ref 30.0–36.0)
MCV: 84.7 fl (ref 78.0–100.0)
Monocytes Absolute: 0.7 10*3/uL (ref 0.1–1.0)
Monocytes Relative: 8.2 % (ref 3.0–12.0)
Neutro Abs: 5 10*3/uL (ref 1.4–7.7)
Neutrophils Relative %: 60.6 % (ref 43.0–77.0)
Platelets: 309 10*3/uL (ref 150.0–400.0)
RBC: 5.27 Mil/uL (ref 4.22–5.81)
RDW: 13.7 % (ref 11.5–15.5)
WBC: 8.3 10*3/uL (ref 4.0–10.5)

## 2020-11-02 LAB — LIPID PANEL
Cholesterol: 133 mg/dL (ref 0–200)
HDL: 39.6 mg/dL (ref >39.00)
LDL Cholesterol: 71 mg/dL (ref 0–99)
NonHDL: 92.99
Total CHOL/HDL Ratio: 3
Triglycerides: 108 mg/dL (ref 0.0–149.0)
VLDL: 21.6 mg/dL (ref 0.0–40.0)

## 2020-11-02 LAB — HEMOGLOBIN A1C: Hgb A1c MFr Bld: 7.5 % — ABNORMAL HIGH (ref 4.6–6.5)

## 2020-11-02 MED ORDER — LOSARTAN POTASSIUM 100 MG PO TABS: 100.0000 mg | ORAL_TABLET | Freq: Every day | ORAL | 3 refills | Status: DC

## 2020-11-02 MED ORDER — HYDROCHLOROTHIAZIDE 25 MG PO TABS: 25.0000 mg | ORAL_TABLET | Freq: Every day | ORAL | 3 refills | Status: DC

## 2020-11-02 MED ORDER — LOSARTAN POTASSIUM-HCTZ 100-25 MG PO TABS: 1.0000 | ORAL_TABLET | Freq: Every day | ORAL | 3 refills | Status: DC

## 2020-11-02 NOTE — Patient Instructions (Addendum)
Empagliflozin Oral Tablets What is this medication? EMPAGLIFLOZIN (EM pa gli FLOE zin) helps to treat type 2 diabetes. It helps to control blood sugar. Treatment is combined with diet and exercise. This drug may also reduce the risk of heart attack, stroke, or death if you have type 2 diabetes and risk factors for heart disease. It also treats heart failure. Itmay lower the risk for treatment of heart failure in the hospital. This medicine may be used for other purposes; ask your health care provider orpharmacist if you have questions. COMMON BRAND NAME(S): Jardiance What should I tell my care team before I take this medication? They need to know if you have any of these conditions: dehydration diabetic ketoacidosis diet low in salt eating less due to illness, surgery, dieting, or any other reason having surgery high cholesterol high levels of potassium in the blood history of pancreatitis or pancreas problems history of yeast infection of the penis or vagina if you often drink alcohol infections in the bladder, kidneys, or urinary tract kidney disease liver disease low blood pressure on hemodialysis problems urinating type 1 diabetes uncircumcised male an unusual or allergic reaction to empagliflozin, other medicines, foods, dyes, or preservatives pregnant or trying to get pregnant breast-feeding How should I use this medication? Take this medicine by mouth with water. Take it as directed on the prescription label at the same time every day. You may take it with or without food. Keeptaking it unless your health care provider tells you to stop. A special MedGuide will be given to you by the pharmacist with eachprescription and refill. Be sure to read this information carefully each time. Talk to your health care provider about the use of this medicine in children.Special care may be needed. Overdosage: If you think you have taken too much of this medicine contact apoison control  center or emergency room at once. NOTE: This medicine is only for you. Do not share this medicine with others. What if I miss a dose? If you miss a dose, take it as soon as you can. If it is almost time for yournext dose, take only that dose. Do not take double or extra doses. What may interact with this medication? alcohol diuretics insulin This list may not describe all possible interactions. Give your health care provider a list of all the medicines, herbs, non-prescription drugs, or dietary supplements you use. Also tell them if you smoke, drink alcohol, or use illegaldrugs. Some items may interact with your medicine. What should I watch for while using this medication? Visit your health care provider for regular checks on your progress. Tell your health care provider if your symptoms do not start to get better or if they getworse. This medicine can cause a serious condition in which there is too much acid in the blood. If you develop nausea, vomiting, stomach pain, unusual tiredness, or breathing problems, stop taking this medicine and call your doctor right away.If possible, use a ketone dipstick to check for ketones in your urine. Check with your health care provider if you have severe diarrhea, nausea, and vomiting, or if you sweat a lot. The loss of too much body fluid may make itdangerous for you to take this medicine. A test called the HbA1C (A1C) will be monitored. This is a simple blood test. It measures your blood sugar control over the last 2 to 3 months. You willreceive this test every 3 to 6 months. Learn how to check your blood sugar. Learn the symptoms of  low and high bloodsugar and how to manage them. Always carry a quick-source of sugar with you in case you have symptoms of low blood sugar. Examples include hard sugar candy or glucose tablets. Make sure others know that you can choke if you eat or drink when you develop serious symptoms of low blood sugar, such as seizures or  unconsciousness. Get medicalhelp at once. Tell your health care provider if you have high blood sugar. You might need to change the dose of your medicine. If you are sick or exercising more thanusual, you may need to change the dose of your medicine. What side effects may I notice from receiving this medication? Side effects that you should report to your doctor or health care professionalas soon as possible: allergic reactions (skin rash, itching or hives, swelling of the face, lips, or tongue) breathing problems dizziness feeling faint or lightheaded, falls genital infection (fever; tenderness, redness, or swelling in the genitals or area from the genitals to the back of the rectum) kidney injury (trouble passing urine or change in the amount of urine) low blood sugar (feeling anxious; confusion; dizziness; increased hunger; unusually weak or tired; increased sweating; shakiness; cold, clammy skin; irritable; headache; blurred vision; fast heartbeat; loss of consciousness) muscle weakness nausea, vomiting, unusual stomach upset or pain new pain or tenderness, change in skin color, sores or ulcers, or infection in legs or feet penile discharge, itching, or pain unusual tiredness unusual vaginal discharge, itching, or odor urinary tract infection (fever; chills; a burning feeling when urinating; urgent need to urinate more often; blood in the urine; back pain) Side effects that usually do not require medical attention (report to yourdoctor or health care professional if they continue or are bothersome): mild increase in urination thirsty This list may not describe all possible side effects. Call your doctor for medical advice about side effects. You may report side effects to FDA at1-800-FDA-1088. Where should I keep my medication? Keep out of the reach of children and pets. Store at room temperature between 20 and 25 degrees C (68 and 77 degrees F).Get rid of any unused medicine after the  expiration date. To get rid of medicines that are no longer needed or have expired: Take the medicine to a medicine take-back program. Check with your pharmacy or law enforcement to find a location. If you cannot return the medicine, check the label or package insert to see if the medicine should be thrown out in the garbage or flushed down the toilet. If you are not sure, ask your health care provider. If it is safe to put it in the trash, take the medicine out of the container. Mix the medicine with cat litter, dirt, coffee grounds, or other unwanted substance. Seal the mixture in a bag or container. Put it in the trash. NOTE: This sheet is a summary. It may not cover all possible information. If you have questions about this medicine, talk to your doctor, pharmacist, orhealth care provider.  2022 Elsevier/Gold Standard (2020-06-08 19:51:17)  Zoster Vaccine, Recombinant injection-consider x 2 doses   What is this medicine? ZOSTER VACCINE (ZOS ter vak SEEN) is a vaccine used to reduce the risk of getting shingles. This vaccine is not used to treat shingles or nerve pain from shingles. This medicine may be used for other purposes; ask your health care provider or pharmacist if you have questions. COMMON BRAND NAME(S): [facility] What should I tell my health care provider before I take this medicine? They need to know  if you have any of these conditions: cancer immune system problems an unusual or allergic reaction to Zoster vaccine, other medications, foods, dyes, or preservatives pregnant or trying to get pregnant breast-feeding How should I use this medicine? This vaccine is injected into a muscle. It is given by a health care provider. A copy of Vaccine Information Statements will be given before each vaccination. Be sure to read this information carefully each time. This sheet may change often. Talk to your health care provider about the use of this vaccine in children. This vaccine is not  approved for use in children. Overdosage: If you think you have taken too much of this medicine contact a poison control center or emergency room at once. NOTE: This medicine is only for you. Do not share this medicine with others. What if I miss a dose? Keep appointments for follow-up (booster) doses. It is important not to miss your dose. Call your health care provider if you are unable to keep an appointment. What may interact with this medicine? medicines that suppress your immune system medicines to treat cancer steroid medicines like prednisone or cortisone This list may not describe all possible interactions. Give your health care provider a list of all the medicines, herbs, non-prescription drugs, or dietary supplements you use. Also tell them if you smoke, drink alcohol, or use illegal drugs. Some items may interact with your medicine. What should I watch for while using this medicine? Visit your health care provider regularly. This vaccine, like all vaccines, may not fully protect everyone. What side effects may I notice from receiving this medicine? Side effects that you should report to your doctor or health care professional as soon as possible: allergic reactions (skin rash, itching or hives; swelling of the face, lips, or tongue) trouble breathing Side effects that usually do not require medical attention (report these to your doctor or health care professional if they continue or are bothersome): chills headache fever nausea pain, redness, or irritation at site where injected tiredness vomiting This list may not describe all possible side effects. Call your doctor for medical advice about side effects. You may report side effects to FDA at 1-800-FDA-1088. Where should I keep my medicine? This vaccine is only given by a health care provider. It will not be stored at home. NOTE: This sheet is a summary. It may not cover all possible information. If you have questions about  this medicine, talk to your doctor, pharmacist, or health care provider.  2021 Elsevier/Gold Standard (2019-11-11 16:23:07)

## 2020-11-02 NOTE — Progress Notes (Signed)
Chief Complaint  Patient presents with  . Follow-up   Annual  1. BP sl elevated on losartan 50 mg hctz 25 will monitor titrate meds   Review of Systems  Constitutional: Negative for weight loss.  HENT: Negative for hearing loss.   Eyes: Negative for blurred vision.  Respiratory: Negative for shortness of breath.   Cardiovascular: Negative for chest pain.  Gastrointestinal: Negative for abdominal pain.  Musculoskeletal: Positive for falls.       04/2020  Skin: Negative for rash.  Neurological: Negative for headaches.  Psychiatric/Behavioral: Negative for depression.   Past Medical History:  Diagnosis Date  . Diabetes mellitus without complication ([place])   . Hyperlipidemia   . Hypertension   . Obesity    Past Surgical History:  Procedure Laterality Date  . COLONOSCOPY WITH PROPOFOL N/A 06/18/2018   Procedure: COLONOSCOPY WITH PROPOFOL;  Surgeon: Virgel Manifold, MD;  Location: [facility] ENDOSCOPY;  Service: Endoscopy;  Laterality: N/A;  . TONSILLECTOMY     age 61 or 62   . WISDOM TOOTH EXTRACTION     Family History  Problem Relation Age of Onset  . Cancer Mother        breast  . CAD Father   . Emphysema Father   . Heart disease Father        CABG  . Cancer Father        died 09-07-2019 neuroendocrine tumor liver/stomach dx'ed age 5 y.o    Social History   Socioeconomic History  . Marital status: Married    Spouse name: Not on file  . Number of children: Not on file  . Years of education: Not on file  . Highest education level: Not on file  Occupational History  . Not on file  Tobacco Use  . Smoking status: Never Smoker  . Smokeless tobacco: Former Network engineer  . Vaping Use: Never used  Substance and Sexual Activity  . Alcohol use: Yes  . Drug use: No  . Sexual activity: Yes  Other Topics Concern  . 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    Former  chewing tobacco   Owns guns, wears seat belts, safe in relationship    He is sole caretaker of both his parents       Social Determinants of Radio broadcast assistant Strain: Not on file  Food Insecurity: Not on file  Transportation Needs: Not on file  Physical Activity: Not on file  Stress: Not on file  Social Connections: Not on file  Intimate Partner Violence: Not on file   Current Meds  Medication Sig  . Cholecalciferol (D3-1000 PO) Take 2,000 Units by mouth.  Marland Kitchen CINNAMON PO Take by mouth.  . losartan-hydrochlorothiazide (HYZAAR) 100-25 MG tablet Take 1 tablet by mouth daily. In am  . metFORMIN (GLUCOPHAGE) 500 MG tablet Take 1 tablet (500 mg total) by mouth 2 (two) times daily with a meal.  . pravastatin (PRAVACHOL) 10 MG tablet Take 1 tablet (10 mg total) by mouth daily. qhs  . sitaGLIPtin (JANUVIA) 25 MG tablet Take 1 tablet (25 mg total) by mouth daily.  . [DISCONTINUED] hydrochlorothiazide (HYDRODIURIL) 25 MG tablet Take 1 tablet (25 mg total) by mouth daily. In am  . [DISCONTINUED] losartan (COZAAR) 50 MG tablet Take 1 tablet (50 mg total) by mouth daily.  In am   No Known Allergies No results found for this or any previous visit (from the past 2160 hour(s)). Objective  Body mass index is 47.67 kg/m. Wt Readings from Last 3 Encounters:  11/02/20 (!) 332 lb 3.2 oz (150.7 kg)  04/20/20 (!) 332 lb (150.6 kg)  07/15/19 (!) 330 lb (149.7 kg)   Temp Readings from Last 3 Encounters:  11/02/20 97.8 F (36.6 C) (Oral)  04/20/20 98.7 F (37.1 C) (Oral)  02/27/19 98.2 F (36.8 C) (Oral)   BP Readings from Last 3 Encounters:  11/02/20 136/80  04/20/20 130/82  03/08/19 (!) 144/89   Pulse Readings from Last 3 Encounters:  11/02/20 93  04/20/20 94  03/08/19 100    Physical Exam Vitals and nursing note reviewed.  Constitutional:      Appearance: Normal appearance. He is well-developed and well-groomed. He is morbidly obese.  HENT:     Head: Normocephalic and  atraumatic.  Eyes:     Conjunctiva/sclera: Conjunctivae normal.     Pupils: Pupils are equal, round, and reactive to light.  Cardiovascular:     Rate and Rhythm: Normal rate and regular rhythm.     Heart sounds: Normal heart sounds. No murmur heard.   Pulmonary:     Effort: Pulmonary effort is normal.     Breath sounds: Normal breath sounds.  Abdominal:     Tenderness: There is no abdominal tenderness.     Hernia: A hernia is present. Hernia is present in the umbilical area.  Skin:    General: Skin is warm and dry.  Neurological:     General: No focal deficit present.     Mental Status: He is alert and oriented to person, place, and time. Mental status is at baseline.     Gait: Gait normal.  Psychiatric:        Attention and Perception: Attention and perception normal.        Mood and Affect: Mood and affect normal.        Speech: Speech normal.        Behavior: Behavior normal. Behavior is cooperative.        Thought Content: Thought content normal.        Cognition and Memory: Cognition and memory normal.        Judgment: Judgment normal.     Assessment  Plan  Annual physical exam Had flu shotutd Tdaputd covid 3/3 booster 10/26/20  pna 23 vaccine utd Disc shingrix in future Protectedhep B, MMRimmune declines std check  PSA nl and 04/2020 normal  colonoscopysch 8/30/19normaltortuous colon -f/u in 5 yearsper GI ?M. Uncle colon cancer No need for dermatology referral nowconsider in future Used to chew chewing tobacco  rec healthy diet and exercise disc The next 56 days lifestyle changes   Hypertension associated with diabetes ([place]) - Plan: losartan-hydrochlorothiazide (HYZAAR) 100-25 MG tablet, Lipid panel, Comprehensive metabolic panel, Hemoglobin A1c, CBC with Differential/Platelet Monitor increased dose today  Morbid obesity with BMI of 45.0-49.9, adult (HCC)  rec healthy diet and exercise   Provider: Dr. Olivia Mackie McLean-Scocuzza-[facility]

## 2020-11-05 ENCOUNTER — Other Ambulatory Visit: Payer: Self-pay | Admitting: Internal Medicine

## 2020-11-05 DIAGNOSIS — E1165 Type 2 diabetes mellitus with hyperglycemia: Secondary | ICD-10-CM

## 2020-11-05 MED ORDER — SITAGLIPTIN PHOSPHATE 50 MG PO TABS: 50.0000 mg | ORAL_TABLET | Freq: Every day | ORAL | 3 refills | Status: DC

## 2020-11-26 ENCOUNTER — Other Ambulatory Visit: Payer: Managed Care, Other (non HMO)

## 2020-11-26 DIAGNOSIS — Z20822 Contact with and (suspected) exposure to covid-19: Secondary | ICD-10-CM

## 2020-11-27 LAB — SARS-COV-2, NAA 2 DAY TAT

## 2020-11-27 LAB — NOVEL CORONAVIRUS, NAA: SARS-CoV-2, NAA: DETECTED — AB

## 2020-11-28 ENCOUNTER — Telehealth: Payer: Self-pay | Admitting: *Deleted

## 2020-11-28 NOTE — Telephone Encounter (Signed)
Called to discuss with patient about COVID-19 symptoms and the use of one of the available treatments for those with mild to moderate Covid symptoms and at a high risk of hospitalization.  Pt appears to qualify for outpatient treatment due to co-morbid conditions and/or a member of an at-risk group in accordance with the FDA Emergency Use Authorization.   Patient reports he had very mild symptoms in the beginning, none now.   Symptom onset:  Vaccinated: yes Booster? yes Immunocompromised?  Qualifiers:    Jonathan [name]

## 2021-04-21 ENCOUNTER — Other Ambulatory Visit: Payer: Self-pay | Admitting: Internal Medicine

## 2021-04-21 DIAGNOSIS — I10 Essential (primary) hypertension: Secondary | ICD-10-CM

## 2021-04-21 DIAGNOSIS — E119 Type 2 diabetes mellitus without complications: Secondary | ICD-10-CM

## 2021-05-02 ENCOUNTER — Telehealth: Payer: Self-pay | Admitting: *Deleted

## 2021-05-02 NOTE — Telephone Encounter (Signed)
Please place future orders for lab appt.  

## 2021-05-03 ENCOUNTER — Other Ambulatory Visit: Payer: Self-pay

## 2021-05-03 ENCOUNTER — Other Ambulatory Visit: Payer: Self-pay | Admitting: Internal Medicine

## 2021-05-03 ENCOUNTER — Other Ambulatory Visit (INDEPENDENT_AMBULATORY_CARE_PROVIDER_SITE_OTHER): Payer: Managed Care, Other (non HMO)

## 2021-05-03 DIAGNOSIS — I152 Hypertension secondary to endocrine disorders: Secondary | ICD-10-CM

## 2021-05-03 DIAGNOSIS — E559 Vitamin D deficiency, unspecified: Secondary | ICD-10-CM

## 2021-05-03 DIAGNOSIS — Z13818 Encounter for screening for other digestive system disorders: Secondary | ICD-10-CM

## 2021-05-03 DIAGNOSIS — E1159 Type 2 diabetes mellitus with other circulatory complications: Secondary | ICD-10-CM | POA: Diagnosis not present

## 2021-05-03 DIAGNOSIS — Z125 Encounter for screening for malignant neoplasm of prostate: Secondary | ICD-10-CM

## 2021-05-03 DIAGNOSIS — Z1329 Encounter for screening for other suspected endocrine disorder: Secondary | ICD-10-CM

## 2021-05-03 LAB — CBC WITH DIFFERENTIAL/PLATELET
Basophils Absolute: 0 10*3/uL (ref 0.0–0.1)
Basophils Relative: 0.4 % (ref 0.0–3.0)
Eosinophils Absolute: 0.1 10*3/uL (ref 0.0–0.7)
Eosinophils Relative: 1.5 % (ref 0.0–5.0)
HCT: 44.7 % (ref 39.0–52.0)
Hemoglobin: 15.5 g/dL (ref 13.0–17.0)
Lymphocytes Relative: 26.6 % (ref 12.0–46.0)
Lymphs Abs: 2 10*3/uL (ref 0.7–4.0)
MCHC: 34.7 g/dL (ref 30.0–36.0)
MCV: 85.9 fl (ref 78.0–100.0)
Monocytes Absolute: 0.7 10*3/uL (ref 0.1–1.0)
Monocytes Relative: 8.6 % (ref 3.0–12.0)
Neutro Abs: 4.8 10*3/uL (ref 1.4–7.7)
Neutrophils Relative %: 62.9 % (ref 43.0–77.0)
Platelets: 286 10*3/uL (ref 150.0–400.0)
RBC: 5.2 Mil/uL (ref 4.22–5.81)
RDW: 13.6 % (ref 11.5–15.5)
WBC: 7.6 10*3/uL (ref 4.0–10.5)

## 2021-05-03 LAB — TSH: TSH: 4.86 u[IU]/mL (ref 0.35–5.50)

## 2021-05-03 LAB — COMPREHENSIVE METABOLIC PANEL
ALT: 20 U/L (ref 0–53)
AST: 16 U/L (ref 0–37)
Albumin: 4.2 g/dL (ref 3.5–5.2)
Alkaline Phosphatase: 118 U/L — ABNORMAL HIGH (ref 39–117)
BUN: 19 mg/dL (ref 6–23)
CO2: 26 mEq/L (ref 19–32)
Calcium: 9.2 mg/dL (ref 8.4–10.5)
Chloride: 99 mEq/L (ref 96–112)
Creatinine, Ser: 1.25 mg/dL (ref 0.40–1.50)
GFR: 65.11 mL/min (ref >60.00)
Glucose, Bld: 142 mg/dL — ABNORMAL HIGH (ref 70–99)
Potassium: 4 mEq/L (ref 3.5–5.1)
Sodium: 136 mEq/L (ref 135–145)
Total Bilirubin: 0.6 mg/dL (ref 0.2–1.2)
Total Protein: 7 g/dL (ref 6.0–8.3)

## 2021-05-03 LAB — VITAMIN D 25 HYDROXY (VIT D DEFICIENCY, FRACTURES): VITD: 41.72 ng/mL (ref 30.00–100.00)

## 2021-05-03 LAB — PSA: PSA: 0.57 ng/mL (ref 0.10–4.00)

## 2021-05-03 LAB — LIPID PANEL
Cholesterol: 134 mg/dL (ref 0–200)
HDL: 42 mg/dL (ref >39.00)
LDL Cholesterol: 67 mg/dL (ref 0–99)
NonHDL: 91.64
Total CHOL/HDL Ratio: 3
Triglycerides: 121 mg/dL (ref 0.0–149.0)
VLDL: 24.2 mg/dL (ref 0.0–40.0)

## 2021-05-03 LAB — HEMOGLOBIN A1C: Hgb A1c MFr Bld: 7.7 % — ABNORMAL HIGH (ref 4.6–6.5)

## 2021-05-04 LAB — URINALYSIS, ROUTINE W REFLEX MICROSCOPIC
Bacteria, UA: NONE SEEN /HPF
Bilirubin Urine: NEGATIVE
Glucose, UA: NEGATIVE
Hyaline Cast: NONE SEEN /LPF
Ketones, ur: NEGATIVE
Leukocytes,Ua: NEGATIVE
Nitrite: NEGATIVE
Protein, ur: NEGATIVE
RBC / HPF: NONE SEEN /HPF (ref 0–2)
Specific Gravity, Urine: 1.008 (ref 1.001–1.035)
Squamous Epithelial / HPF: NONE SEEN /HPF (ref <=5)
WBC, UA: NONE SEEN /HPF (ref 0–5)
pH: 6 (ref 5.0–8.0)

## 2021-05-04 LAB — MICROALBUMIN / CREATININE URINE RATIO
Creatinine, Urine: 43 mg/dL (ref 20–320)
Microalb, Ur: <0.2 mg/dL

## 2021-05-04 LAB — MICROSCOPIC MESSAGE

## 2021-05-06 ENCOUNTER — Other Ambulatory Visit: Payer: Self-pay | Admitting: Internal Medicine

## 2021-05-06 DIAGNOSIS — I152 Hypertension secondary to endocrine disorders: Secondary | ICD-10-CM | POA: Insufficient documentation

## 2021-05-06 DIAGNOSIS — E1159 Type 2 diabetes mellitus with other circulatory complications: Secondary | ICD-10-CM

## 2021-05-06 LAB — HEPATITIS C ANTIBODY
Hepatitis C Ab: NONREACTIVE
SIGNAL TO CUT-OFF: 0.01 (ref <1.00)

## 2021-05-06 MED ORDER — OZEMPIC (0.25 OR 0.5 MG/DOSE) 2 MG/1.5ML ~~LOC~~ SOPN: 0.5000 mg | PEN_INJECTOR | SUBCUTANEOUS | 2 refills | Status: DC

## 2021-05-07 ENCOUNTER — Other Ambulatory Visit: Payer: Self-pay | Admitting: Internal Medicine

## 2021-05-07 DIAGNOSIS — I152 Hypertension secondary to endocrine disorders: Secondary | ICD-10-CM

## 2021-05-07 MED ORDER — OZEMPIC (0.25 OR 0.5 MG/DOSE) 2 MG/1.5ML ~~LOC~~ SOPN: 0.2500 mg | PEN_INJECTOR | SUBCUTANEOUS | 2 refills | Status: DC

## 2021-05-10 ENCOUNTER — Other Ambulatory Visit: Payer: Self-pay

## 2021-05-10 ENCOUNTER — Encounter: Payer: Self-pay | Admitting: Internal Medicine

## 2021-05-10 ENCOUNTER — Telehealth: Payer: Self-pay

## 2021-05-10 ENCOUNTER — Ambulatory Visit: Payer: Managed Care, Other (non HMO)

## 2021-05-10 DIAGNOSIS — E1165 Type 2 diabetes mellitus with hyperglycemia: Secondary | ICD-10-CM

## 2021-05-10 NOTE — Telephone Encounter (Signed)
Pt presented for an Ozempic Teaching and brought in his medication. Pt declined injecting himself with the medication today. Pt was concerned over the possibilities of Cancer. Pt reports a family risk of Cancer and his wife looked up the Ozempic medicaiton and that it can possibly accelerate/cause the formation of cancer. Pt wanted the providers advise on the cancer risks of Ozempic before administering the first dose. Jonathan [name] states that he will message [place] separately through a mychart message as well with more details.  Pt states that he must let Dr. Lorin Picket know/ Sending the message to both Dr. Lorin Picket and PCP Dr. Desma Maxim- Scocuzza.

## 2021-05-10 NOTE — Telephone Encounter (Signed)
Called and spoke with Jonathan [name]. [name] verbalized understanding and asks which medicatoin Dr. French Ana Prefers. If she wants him to take the ozempic, He shall. He has sent in a patient message. Forwarding the message to dr French Ana.

## 2021-05-10 NOTE — Telephone Encounter (Signed)
Risk in lab animals thyroid cancer but not yet proven in humans does he want to try or a different alternative like jardiance side effects of this uti/yeast infection?

## 2021-05-10 NOTE — Progress Notes (Addendum)
Patient education given on Ozempic and the patient expresses understanding and acceptance of instructions. Brooke Steinhilber Chyrl Civatte 05/10/2021 9:29 AM

## 2021-05-12 NOTE — Telephone Encounter (Signed)
I saw Mr Goostree father.  Per Azzell, he wanted you to have his family history information.  Is there anything that I need to have documented to release this information, or given the patient has requested the information, is it ok to document in his chart.  See me if questions.

## 2021-05-13 ENCOUNTER — Encounter: Payer: Self-pay | Admitting: *Deleted

## 2021-05-13 NOTE — Telephone Encounter (Signed)
Patient is willing to try medication of PCP choice just wanted to make syre you are aware of family history of his father having metastatic neuroendocrine tumor.

## 2021-05-15 ENCOUNTER — Other Ambulatory Visit: Payer: Self-pay | Admitting: Internal Medicine

## 2021-05-15 DIAGNOSIS — I152 Hypertension secondary to endocrine disorders: Secondary | ICD-10-CM

## 2021-05-15 MED ORDER — EMPAGLIFLOZIN 10 MG PO TABS: 10.0000 mg | ORAL_TABLET | Freq: Every day | ORAL | 3 refills | Status: DC

## 2021-05-15 NOTE — Telephone Encounter (Signed)
Samples please jardiance 10 mg qd  X 2 bottles Thanks

## 2021-05-15 NOTE — Telephone Encounter (Signed)
Labelled and put upfront for the Patient.  Patient informed and verbalized understanding.

## 2021-05-16 NOTE — Telephone Encounter (Signed)
Samples were pulled for the Patient and placed upfront. Patient was informed via telephone call.

## 2021-06-14 ENCOUNTER — Telehealth: Payer: Self-pay

## 2021-06-14 NOTE — Telephone Encounter (Signed)
Confirmed fax for [facility] prescriber response form to [facility]. Sent to scan

## 2021-06-21 ENCOUNTER — Other Ambulatory Visit: Payer: Self-pay | Admitting: Internal Medicine

## 2021-06-21 DIAGNOSIS — E119 Type 2 diabetes mellitus without complications: Secondary | ICD-10-CM

## 2021-08-16 ENCOUNTER — Ambulatory Visit (INDEPENDENT_AMBULATORY_CARE_PROVIDER_SITE_OTHER): Payer: Managed Care, Other (non HMO) | Admitting: Internal Medicine

## 2021-08-16 ENCOUNTER — Encounter: Payer: Self-pay | Admitting: Internal Medicine

## 2021-08-16 ENCOUNTER — Other Ambulatory Visit: Payer: Self-pay

## 2021-08-16 VITALS — BP 110/70 | HR 97 | Temp 98.4°F | Ht 70.0 in | Wt 324.9 lb

## 2021-08-16 DIAGNOSIS — I152 Hypertension secondary to endocrine disorders: Secondary | ICD-10-CM | POA: Diagnosis not present

## 2021-08-16 DIAGNOSIS — E1159 Type 2 diabetes mellitus with other circulatory complications: Secondary | ICD-10-CM

## 2021-08-16 DIAGNOSIS — I10 Essential (primary) hypertension: Secondary | ICD-10-CM | POA: Diagnosis not present

## 2021-08-16 DIAGNOSIS — Z23 Encounter for immunization: Secondary | ICD-10-CM

## 2021-08-16 MED ORDER — HYDROCHLOROTHIAZIDE 25 MG PO TABS: 25.0000 mg | ORAL_TABLET | Freq: Every day | ORAL | 3 refills | Status: DC

## 2021-08-16 MED ORDER — LOSARTAN POTASSIUM 100 MG PO TABS: 100.0000 mg | ORAL_TABLET | Freq: Every day | ORAL | 3 refills | Status: DC

## 2021-08-16 NOTE — Patient Instructions (Addendum)
Call and schedule eye exam with [facility]    Ref. Range 05/03/2021 08:02  Hemoglobin A1C Latest Ref Range: 4.6 - 6.5 % 7.7 (H)   Pneumonia 23 vaccine due in 2026   Pneumococcal Conjugate Vaccine (Prevnar 13) Suspension for Injection What is this medication? PNEUMOCOCCAL VACCINE (NEU mo KOK al vak SEEN) is a vaccine used to prevent pneumococcus bacterial infections. These bacteria can cause serious infections like pneumonia, meningitis, and blood infections. This vaccine will lower your chance of getting pneumonia. If you do get pneumonia, it can make your symptoms milder and your illness shorter. This vaccine will not treat an infection and will not cause infection. This vaccine is recommended for infants and young children, adults with certain medical conditions, and adults 65 years or older. This medicine may be used for other purposes; ask your health care provider or pharmacist if you have questions. COMMON BRAND NAME(S): Prevnar, Prevnar 13 What should I tell my care team before I take this medication? They need to know if you have any of these conditions: bleeding problems fever immune system problems an unusual or allergic reaction to pneumococcal vaccine, diphtheria toxoid, other vaccines, latex, other medicines, foods, dyes, or preservatives pregnant or trying to get pregnant breast-feeding How should I use this medication? This vaccine is for injection into a muscle. It is given by a health care professional. A copy of Vaccine Information Statements will be given before each vaccination. Read this sheet carefully each time. The sheet may change frequently. Talk to your pediatrician regarding the use of this medicine in children. While this drug may be prescribed for children as young as 69 weeks old for selected conditions, precautions do apply. Overdosage: If you think you have taken too much of this medicine contact a poison control center or emergency room at once. NOTE: This  medicine is only for you. Do not share this medicine with others. What if I miss a dose? It is important not to miss your dose. Call your doctor or health care professional if you are unable to keep an appointment. What may interact with this medication? medicines for cancer chemotherapy medicines that suppress your immune function steroid medicines like prednisone or cortisone This list may not describe all possible interactions. Give your health care provider a list of all the medicines, herbs, non-prescription drugs, or dietary supplements you use. Also tell them if you smoke, drink alcohol, or use illegal drugs. Some items may interact with your medicine. What should I watch for while using this medication? Mild fever and pain should go away in 3 days or less. Report any unusual symptoms to your doctor or health care professional. What side effects may I notice from receiving this medication? Side effects that you should report to your doctor or health care professional as soon as possible: allergic reactions like skin rash, itching or hives, swelling of the face, lips, or tongue breathing problems confused fast or irregular heartbeat fever over 102 degrees F seizures unusual bleeding or bruising unusual muscle weakness Side effects that usually do not require medical attention (report to your doctor or health care professional if they continue or are bothersome): aches and pains diarrhea fever of 102 degrees F or less headache irritable loss of appetite pain, tender at site where injected trouble sleeping This list may not describe all possible side effects. Call your doctor for medical advice about side effects. You may report side effects to FDA at 1-800-FDA-1088. Where should I keep my medication? This does  not apply. This vaccine is given in a clinic, pharmacy, doctor's office, or other health care setting and will not be stored at home. NOTE: This sheet is a summary. It may  not cover all possible information. If you have questions about this medicine, talk to your doctor, pharmacist, or health care provider.  2022 Elsevier/Gold Standard (2014-07-13 10:27:27)   Zoster Vaccine, Recombinant injection What is this medication? ZOSTER VACCINE (ZOS ter vak SEEN) is a vaccine used to reduce the risk of getting shingles. This vaccine is not used to treat shingles or nerve pain from shingles. This medicine may be used for other purposes; ask your health care provider or pharmacist if you have questions. COMMON BRAND NAME(S): [facility] What should I tell my care team before I take this medication? They need to know if you have any of these conditions: cancer immune system problems an unusual or allergic reaction to Zoster vaccine, other medications, foods, dyes, or preservatives pregnant or trying to get pregnant breast-feeding How should I use this medication? This vaccine is injected into a muscle. It is given by a health care provider. A copy of Vaccine Information Statements will be given before each vaccination. Be sure to read this information carefully each time. This sheet may change often. Talk to your health care provider about the use of this vaccine in children. This vaccine is not approved for use in children. Overdosage: If you think you have taken too much of this medicine contact a poison control center or emergency room at once. NOTE: This medicine is only for you. Do not share this medicine with others. What if I miss a dose? Keep appointments for follow-up (booster) doses. It is important not to miss your dose. Call your health care provider if you are unable to keep an appointment. What may interact with this medication? medicines that suppress your immune system medicines to treat cancer steroid medicines like prednisone or cortisone This list may not describe all possible interactions. Give your health care provider a list of all the medicines,  herbs, non-prescription drugs, or dietary supplements you use. Also tell them if you smoke, drink alcohol, or use illegal drugs. Some items may interact with your medicine. What should I watch for while using this medication? Visit your health care provider regularly. This vaccine, like all vaccines, may not fully protect everyone. What side effects may I notice from receiving this medication? Side effects that you should report to your doctor or health care professional as soon as possible: allergic reactions (skin rash, itching or hives; swelling of the face, lips, or tongue) trouble breathing Side effects that usually do not require medical attention (report these to your doctor or health care professional if they continue or are bothersome): chills headache fever nausea pain, redness, or irritation at site where injected tiredness vomiting This list may not describe all possible side effects. Call your doctor for medical advice about side effects. You may report side effects to FDA at 1-800-FDA-1088. Where should I keep my medication? This vaccine is only given by a health care provider. It will not be stored at home. NOTE: This sheet is a summary. It may not cover all possible information. If you have questions about this medicine, talk to your doctor, pharmacist, or health care provider.  2022 Elsevier/Gold Standard (2019-11-11 16:23:07)

## 2021-08-16 NOTE — Progress Notes (Signed)
Chief Complaint  Patient presents with   Follow-up   F/u  1. Htn controlled on hctz 25 losartan 100 mg qd  2. Dm 2 last A1C 7.7 on jardiance 10 mg qd tolerating and metformin 500 mg bid pravachol 10 mg qhs  3. His mom died 03/23/2021 and trying to close estate as he is the only child his grand daughter died 19-Aug-2019 and dad died 09/19/19 but he is ok and still worked with death on mom and only took 1 week off work     Review of Systems  Constitutional:  Negative for weight loss.  HENT:  Negative for hearing loss.   Eyes:  Negative for blurred vision.  Respiratory:  Negative for shortness of breath.   Cardiovascular:  Negative for chest pain.  Gastrointestinal:  Negative for abdominal pain.  Musculoskeletal:  Negative for falls.  Skin:  Negative for rash.  Neurological:  Negative for headaches.  Psychiatric/Behavioral:  Negative for depression.   Past Medical History:  Diagnosis Date   COVID-19    11/2020   Diabetes mellitus without complication ([place])    Hyperlipidemia    Hypertension    Obesity    Past Surgical History:  Procedure Laterality Date   COLONOSCOPY WITH PROPOFOL N/A 06/18/2018   Procedure: COLONOSCOPY WITH PROPOFOL;  Surgeon: Virgel Manifold, MD;  Location: [facility] ENDOSCOPY;  Service: Endoscopy;  Laterality: N/A;   TONSILLECTOMY     age 75 or 4    WISDOM TOOTH EXTRACTION     Family History  Problem Relation Age of Onset   Cancer Mother        breast   Cancer Father        died 07-Sep-2019 neuroendocrine tumor liver/stomach dx'ed age 45 y.o    CAD Father    Emphysema Father    Heart disease Father        CABG   Social History   Socioeconomic History   Marital status: Married    Spouse name: Not on file   Number of children: Not on file   Years of education: Not on file   Highest education level: Not on file  Occupational History   Not on file  Tobacco Use   Smoking status: Never   Smokeless tobacco: Former  Scientific laboratory technician Use: Never used   Substance and Sexual Activity   Alcohol use: Yes   Drug use: No   Sexual activity: Yes  Other Topics Concern   Not on file  Social History Narrative   Married    2 sons age 73 and 75 as of 04/21/18    Works in Scientist, 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    Former chewing tobacco   Owns guns, wears seat belts, safe in relationship    He is sole caretaker of both his parents       Social Determinants of Radio broadcast assistant Strain: Not on file  Food Insecurity: Not on file  Transportation Needs: Not on file  Physical Activity: Not on file  Stress: Not on file  Social Connections: Not on file  Intimate Partner Violence: Not on file   Current Meds  Medication Sig   Cholecalciferol (D3-1000 PO) Take 2,000 Units by mouth.   CINNAMON PO Take by mouth.   empagliflozin (JARDIANCE) 10 MG TABS tablet Take 1 tablet (10 mg total) by mouth daily before breakfast.   metFORMIN (GLUCOPHAGE) 500 MG tablet TAKE 1 TABLET (500 MG TOTAL) BY MOUTH  2 (TWO) TIMES DAILY WITH A MEAL.   pravastatin (PRAVACHOL) 10 MG tablet TAKE 1 TABLET (10 MG TOTAL) BY MOUTH DAILY AT BEDTIME   [DISCONTINUED] hydrochlorothiazide (HYDRODIURIL) 25 MG tablet Take 1 tablet (25 mg total) by mouth daily. In the am   [DISCONTINUED] losartan (COZAAR) 100 MG tablet Take 1 tablet (100 mg total) by mouth daily. If combination pill comes in dispense to patient and let him know d/c hyzaar 100-25 since on backorder and advise pt   No Known Allergies No results found for this or any previous visit (from the past 2160 hour(s)). Objective  Body mass index is 46.62 kg/m. Wt Readings from Last 3 Encounters:  08/16/21 (!) 324 lb 14.1 oz (147.4 kg)  11/02/20 (!) 332 lb 3.2 oz (150.7 kg)  04/20/20 (!) 332 lb (150.6 kg)   Temp Readings from Last 3 Encounters:  08/16/21 98.4 F (36.9 C) (Oral)  11/02/20 97.8 F (36.6 C) (Oral)  04/20/20 98.7 F (37.1 C) (Oral)   BP Readings from Last 3 Encounters:  08/16/21 110/70  11/02/20  136/80  04/20/20 130/82   Pulse Readings from Last 3 Encounters:  08/16/21 97  11/02/20 93  04/20/20 94    Physical Exam Vitals and nursing note reviewed.  Constitutional:      Appearance: Normal appearance. He is well-developed and well-groomed. He is morbidly obese.  HENT:     Head: Normocephalic and atraumatic.  Eyes:     Conjunctiva/sclera: Conjunctivae normal.     Pupils: Pupils are equal, round, and reactive to light.  Cardiovascular:     Rate and Rhythm: Normal rate and regular rhythm.     Heart sounds: Normal heart sounds. No murmur heard. Pulmonary:     Effort: Pulmonary effort is normal.     Breath sounds: Normal breath sounds.  Abdominal:     Tenderness: There is no abdominal tenderness.  Skin:    General: Skin is warm and dry.  Neurological:     General: No focal deficit present.     Mental Status: He is alert and oriented to person, place, and time. Mental status is at baseline.     Gait: Gait normal.  Psychiatric:        Attention and Perception: Attention and perception normal.        Mood and Affect: Mood and affect normal.        Speech: Speech normal.        Behavior: Behavior normal. Behavior is cooperative.        Thought Content: Thought content normal.        Cognition and Memory: Cognition and memory normal.        Judgment: Judgment normal.    Assessment  Plan  Hypertension associated with diabetes ([place]) - Plan: Comprehensive metabolic panel, Lipid panel, Hemoglobin A1c, CBC with Differential/Platelet hctz 25 losartan 100 mg qd  last A1C 7.7 on jardiance 10 mg qd tolerating and metformin 500 mg bid pravachol 10 mg qhs  Call [facility] overdue eye exam last 04/2020  HM Had flu shot utd given today Tdap utd  covid 3/3 booster declines booster for now  pna 23 vaccine utd, consider prevnar with next labs Disc shingrix in future  Protected hep B, MMR immune declines std check    PSA nl and 05/03/21 0.57 colonoscopy sch 06/18/18 normal  tortuous colon  -f/u in 5 years per GI  ?M. Uncle colon cancer  No need for dermatology referral now consider in future  Used  to chew chewing tobacco   rec healthy diet and exercise   Provider: Dr. Olivia Mackie McLean-Scocuzza-Internal Medicine

## 2021-09-09 ENCOUNTER — Other Ambulatory Visit (INDEPENDENT_AMBULATORY_CARE_PROVIDER_SITE_OTHER): Payer: Managed Care, Other (non HMO)

## 2021-09-09 ENCOUNTER — Encounter: Payer: Self-pay | Admitting: Internal Medicine

## 2021-09-09 ENCOUNTER — Other Ambulatory Visit: Payer: Self-pay

## 2021-09-09 ENCOUNTER — Other Ambulatory Visit: Payer: Self-pay | Admitting: Internal Medicine

## 2021-09-09 ENCOUNTER — Telehealth: Payer: Self-pay

## 2021-09-09 DIAGNOSIS — E1159 Type 2 diabetes mellitus with other circulatory complications: Secondary | ICD-10-CM | POA: Diagnosis not present

## 2021-09-09 DIAGNOSIS — I152 Hypertension secondary to endocrine disorders: Secondary | ICD-10-CM | POA: Diagnosis not present

## 2021-09-09 DIAGNOSIS — E876 Hypokalemia: Secondary | ICD-10-CM

## 2021-09-09 LAB — CBC WITH DIFFERENTIAL/PLATELET
Basophils Absolute: 0 10*3/uL (ref 0.0–0.1)
Basophils Relative: 0.6 % (ref 0.0–3.0)
Eosinophils Absolute: 0.2 10*3/uL (ref 0.0–0.7)
Eosinophils Relative: 2.5 % (ref 0.0–5.0)
HCT: 47.4 % (ref 39.0–52.0)
Hemoglobin: 16.1 g/dL (ref 13.0–17.0)
Lymphocytes Relative: 28.8 % (ref 12.0–46.0)
Lymphs Abs: 2.1 10*3/uL (ref 0.7–4.0)
MCHC: 33.9 g/dL (ref 30.0–36.0)
MCV: 85.4 fl (ref 78.0–100.0)
Monocytes Absolute: 0.6 10*3/uL (ref 0.1–1.0)
Monocytes Relative: 8.8 % (ref 3.0–12.0)
Neutro Abs: 4.3 10*3/uL (ref 1.4–7.7)
Neutrophils Relative %: 59.3 % (ref 43.0–77.0)
Platelets: 281 10*3/uL (ref 150.0–400.0)
RBC: 5.55 Mil/uL (ref 4.22–5.81)
RDW: 14.3 % (ref 11.5–15.5)
WBC: 7.2 10*3/uL (ref 4.0–10.5)

## 2021-09-09 LAB — LIPID PANEL
Cholesterol: 135 mg/dL (ref 0–200)
HDL: 39.5 mg/dL (ref >39.00)
LDL Cholesterol: 69 mg/dL (ref 0–99)
NonHDL: 95.19
Total CHOL/HDL Ratio: 3
Triglycerides: 131 mg/dL (ref 0.0–149.0)
VLDL: 26.2 mg/dL (ref 0.0–40.0)

## 2021-09-09 LAB — COMPREHENSIVE METABOLIC PANEL
ALT: 23 U/L (ref 0–53)
AST: 19 U/L (ref 0–37)
Albumin: 4.3 g/dL (ref 3.5–5.2)
Alkaline Phosphatase: 109 U/L (ref 39–117)
BUN: 23 mg/dL (ref 6–23)
CO2: 28 mEq/L (ref 19–32)
Calcium: 9.5 mg/dL (ref 8.4–10.5)
Chloride: 98 mEq/L (ref 96–112)
Creatinine, Ser: 1.46 mg/dL (ref 0.40–1.50)
GFR: 53.91 mL/min — ABNORMAL LOW (ref >60.00)
Glucose, Bld: 139 mg/dL — ABNORMAL HIGH (ref 70–99)
Potassium: 3.2 mEq/L — ABNORMAL LOW (ref 3.5–5.1)
Sodium: 135 mEq/L (ref 135–145)
Total Bilirubin: 0.7 mg/dL (ref 0.2–1.2)
Total Protein: 7.4 g/dL (ref 6.0–8.3)

## 2021-09-09 LAB — HEMOGLOBIN A1C: Hgb A1c MFr Bld: 8 % — ABNORMAL HIGH (ref 4.6–6.5)

## 2021-09-09 MED ORDER — POTASSIUM CHLORIDE CRYS ER 20 MEQ PO TBCR: 20.0000 meq | EXTENDED_RELEASE_TABLET | Freq: Two times a day (BID) | ORAL | 0 refills | Status: DC

## 2021-09-09 NOTE — Telephone Encounter (Signed)
Ordered lab per result note message

## 2021-09-09 NOTE — Progress Notes (Signed)
Blood cts normal   A1c 8.0 increased  -is he agreeable to increase jardiance 25 mg daily? Goal A1c 7.0   Cholesterol normal   Potassium low  -sent potassium to pharmacy   Hydrate with water 55-64 ounces daily avoid nsaids   I want to cut hctz BP pill in 1/2 12.5 mg hctz  -order and schedule bmet in 2 weeks   -mail high potassium food list  Blood cts normal

## 2021-09-16 ENCOUNTER — Ambulatory Visit: Payer: Managed Care, Other (non HMO)

## 2021-09-27 ENCOUNTER — Other Ambulatory Visit: Payer: Self-pay

## 2021-09-27 ENCOUNTER — Other Ambulatory Visit (INDEPENDENT_AMBULATORY_CARE_PROVIDER_SITE_OTHER): Payer: Managed Care, Other (non HMO)

## 2021-09-27 ENCOUNTER — Other Ambulatory Visit: Payer: Self-pay | Admitting: Internal Medicine

## 2021-09-27 DIAGNOSIS — E876 Hypokalemia: Secondary | ICD-10-CM | POA: Diagnosis not present

## 2021-09-27 LAB — BASIC METABOLIC PANEL
BUN: 21 mg/dL (ref 6–23)
CO2: 26 mEq/L (ref 19–32)
Calcium: 9 mg/dL (ref 8.4–10.5)
Chloride: 103 mEq/L (ref 96–112)
Creatinine, Ser: 1.28 mg/dL (ref 0.40–1.50)
GFR: 63.1 mL/min (ref >60.00)
Glucose, Bld: 143 mg/dL — ABNORMAL HIGH (ref 70–99)
Potassium: 3.8 mEq/L (ref 3.5–5.1)
Sodium: 138 mEq/L (ref 135–145)

## 2021-09-27 MED ORDER — POTASSIUM CHLORIDE CRYS ER 20 MEQ PO TBCR: 20.0000 meq | EXTENDED_RELEASE_TABLET | Freq: Every day | ORAL | 3 refills | Status: DC

## 2021-10-01 ENCOUNTER — Other Ambulatory Visit: Payer: Self-pay | Admitting: Internal Medicine

## 2021-10-01 DIAGNOSIS — E876 Hypokalemia: Secondary | ICD-10-CM

## 2021-10-01 MED ORDER — POTASSIUM CHLORIDE CRYS ER 20 MEQ PO TBCR: 20.0000 meq | EXTENDED_RELEASE_TABLET | Freq: Every day | ORAL | 3 refills | Status: DC

## 2021-10-02 ENCOUNTER — Other Ambulatory Visit: Payer: Self-pay | Admitting: Internal Medicine

## 2021-10-02 DIAGNOSIS — E876 Hypokalemia: Secondary | ICD-10-CM

## 2021-10-02 MED ORDER — POTASSIUM CHLORIDE CRYS ER 20 MEQ PO TBCR: 20.0000 meq | EXTENDED_RELEASE_TABLET | Freq: Every day | ORAL | 3 refills | Status: DC

## 2021-10-25 ENCOUNTER — Other Ambulatory Visit: Payer: Self-pay

## 2021-10-25 ENCOUNTER — Encounter: Payer: Self-pay | Admitting: Internal Medicine

## 2021-10-28 ENCOUNTER — Other Ambulatory Visit: Payer: Self-pay | Admitting: Internal Medicine

## 2021-10-28 DIAGNOSIS — I152 Hypertension secondary to endocrine disorders: Secondary | ICD-10-CM

## 2021-10-28 MED ORDER — EMPAGLIFLOZIN 25 MG PO TABS: 25.0000 mg | ORAL_TABLET | Freq: Every day | ORAL | 3 refills | Status: DC

## 2021-10-28 NOTE — Telephone Encounter (Signed)
Okay to increase dose?  

## 2022-02-14 ENCOUNTER — Encounter: Payer: Self-pay | Admitting: Internal Medicine

## 2022-02-14 ENCOUNTER — Ambulatory Visit (INDEPENDENT_AMBULATORY_CARE_PROVIDER_SITE_OTHER): Payer: Managed Care, Other (non HMO) | Admitting: Internal Medicine

## 2022-02-14 VITALS — BP 130/86 | HR 89 | Temp 98.2°F | Resp 14 | Ht 70.0 in | Wt 321.4 lb

## 2022-02-14 DIAGNOSIS — I152 Hypertension secondary to endocrine disorders: Secondary | ICD-10-CM | POA: Diagnosis not present

## 2022-02-14 DIAGNOSIS — R946 Abnormal results of thyroid function studies: Secondary | ICD-10-CM | POA: Diagnosis not present

## 2022-02-14 DIAGNOSIS — E119 Type 2 diabetes mellitus without complications: Secondary | ICD-10-CM

## 2022-02-14 DIAGNOSIS — Z0000 Encounter for general adult medical examination without abnormal findings: Secondary | ICD-10-CM

## 2022-02-14 DIAGNOSIS — I10 Essential (primary) hypertension: Secondary | ICD-10-CM

## 2022-02-14 DIAGNOSIS — E1159 Type 2 diabetes mellitus with other circulatory complications: Secondary | ICD-10-CM | POA: Diagnosis not present

## 2022-02-14 LAB — CBC WITH DIFFERENTIAL/PLATELET
Basophils Absolute: 0 10*3/uL (ref 0.0–0.1)
Basophils Relative: 0.7 % (ref 0.0–3.0)
Eosinophils Absolute: 0.1 10*3/uL (ref 0.0–0.7)
Eosinophils Relative: 1.9 % (ref 0.0–5.0)
HCT: 48.6 % (ref 39.0–52.0)
Hemoglobin: 16.2 g/dL (ref 13.0–17.0)
Lymphocytes Relative: 26.8 % (ref 12.0–46.0)
Lymphs Abs: 1.8 10*3/uL (ref 0.7–4.0)
MCHC: 33.4 g/dL (ref 30.0–36.0)
MCV: 85.2 fl (ref 78.0–100.0)
Monocytes Absolute: 0.5 10*3/uL (ref 0.1–1.0)
Monocytes Relative: 8 % (ref 3.0–12.0)
Neutro Abs: 4.1 10*3/uL (ref 1.4–7.7)
Neutrophils Relative %: 62.6 % (ref 43.0–77.0)
Platelets: 290 10*3/uL (ref 150.0–400.0)
RBC: 5.7 Mil/uL (ref 4.22–5.81)
RDW: 14 % (ref 11.5–15.5)
WBC: 6.6 10*3/uL (ref 4.0–10.5)

## 2022-02-14 LAB — T3, FREE: T3, Free: 3.8 pg/mL (ref 2.3–4.2)

## 2022-02-14 LAB — LIPID PANEL
Cholesterol: 130 mg/dL (ref 0–200)
HDL: 41.2 mg/dL (ref >39.00)
LDL Cholesterol: 69 mg/dL (ref 0–99)
NonHDL: 88.92
Total CHOL/HDL Ratio: 3
Triglycerides: 100 mg/dL (ref 0.0–149.0)
VLDL: 20 mg/dL (ref 0.0–40.0)

## 2022-02-14 LAB — COMPREHENSIVE METABOLIC PANEL
ALT: 20 U/L (ref 0–53)
AST: 17 U/L (ref 0–37)
Albumin: 4.2 g/dL (ref 3.5–5.2)
Alkaline Phosphatase: 113 U/L (ref 39–117)
BUN: 20 mg/dL (ref 6–23)
CO2: 23 mEq/L (ref 19–32)
Calcium: 9.2 mg/dL (ref 8.4–10.5)
Chloride: 102 mEq/L (ref 96–112)
Creatinine, Ser: 1.23 mg/dL (ref 0.40–1.50)
GFR: 66.02 mL/min (ref >60.00)
Glucose, Bld: 122 mg/dL — ABNORMAL HIGH (ref 70–99)
Potassium: 3.7 mEq/L (ref 3.5–5.1)
Sodium: 135 mEq/L (ref 135–145)
Total Bilirubin: 0.7 mg/dL (ref 0.2–1.2)
Total Protein: 7.4 g/dL (ref 6.0–8.3)

## 2022-02-14 LAB — T4, FREE: Free T4: 0.91 ng/dL (ref 0.60–1.60)

## 2022-02-14 LAB — HEMOGLOBIN A1C: Hgb A1c MFr Bld: 7.9 % — ABNORMAL HIGH (ref 4.6–6.5)

## 2022-02-14 LAB — TSH: TSH: 3.84 u[IU]/mL (ref 0.35–5.50)

## 2022-02-14 MED ORDER — PRAVASTATIN SODIUM 10 MG PO TABS: ORAL_TABLET | ORAL | 3 refills | Status: DC

## 2022-02-14 MED ORDER — METFORMIN HCL 500 MG PO TABS: 500.0000 mg | ORAL_TABLET | Freq: Two times a day (BID) | ORAL | 3 refills | Status: DC

## 2022-02-14 MED ORDER — HYDROCHLOROTHIAZIDE 25 MG PO TABS: 12.5000 mg | ORAL_TABLET | Freq: Every day | ORAL | 3 refills | Status: DC

## 2022-02-14 NOTE — Patient Instructions (Addendum)
Call and sch eye exam please  ?[name] in [place]  ? ? ?

## 2022-02-14 NOTE — Progress Notes (Signed)
Chief Complaint  ?Patient presents with  ? Follow-up  ?  6 mon, denies any concerns  ? ?Annual  ?1. Dm 2 with Htn A1c >8.0 09/09/21 jardiance 25 mg qd metformin 500 mg bid, losartan 100 mg qd and hctz 12.5 mg qd tolerating pravachol 10 mg qhs ? ?Review of Systems  ?Constitutional:  Negative for weight loss.  ?HENT:  Negative for hearing loss.   ?Eyes:  Negative for blurred vision.  ?Respiratory:  Negative for shortness of breath.   ?Cardiovascular:  Negative for chest pain.  ?Gastrointestinal:  Negative for abdominal pain and blood in stool.  ?Genitourinary:  Negative for dysuria.  ?Musculoskeletal:  Negative for back pain, falls and joint pain.  ?Skin:  Negative for rash.  ?Neurological:  Negative for headaches.  ?Psychiatric/Behavioral:  Negative for depression.   ?Past Medical History:  ?Diagnosis Date  ? COVID-19   ? 11/2020  ? Diabetes mellitus without complication ([place])   ? Hyperlipidemia   ? Hypertension   ? Obesity   ? ?Past Surgical History:  ?Procedure Laterality Date  ? COLONOSCOPY WITH PROPOFOL N/A 06/18/2018  ? Procedure: COLONOSCOPY WITH PROPOFOL;  Surgeon: Virgel Manifold, MD;  Location: [facility] ENDOSCOPY;  Service: Endoscopy;  Laterality: N/A;  ? TONSILLECTOMY    ? age 14 or 21   ? WISDOM TOOTH EXTRACTION    ? ?Family History  ?Problem Relation Age of Onset  ? Cancer Mother   ?     breast  ? Cancer Father   ?     died 09-22-19 neuroendocrine tumor liver/stomach dx'ed age 67 y.o   ? CAD Father   ? Emphysema Father   ? Heart disease Father   ?     CABG  ? ?Social History  ? ?Socioeconomic History  ? Marital status: Married  ?  Spouse name: Not on file  ? Number of children: Not on file  ? Years of education: Not on file  ? Highest education level: Not on file  ?Occupational History  ? Not on file  ?Tobacco Use  ? Smoking status: Never  ? Smokeless tobacco: Former  ?Vaping Use  ? Vaping Use: Never used  ?Substance and Sexual Activity  ? Alcohol use: Yes  ? Drug use: No  ? Sexual activity: Yes  ?Other  Topics Concern  ? Not on file  ?Social History Narrative  ? Married   ? 2 sons age 31 and 72 as of 04/21/18   ? 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   ? Former chewing tobacco  ? Owns guns, wears seat belts, safe in relationship   ? He is sole caretaker of both his parents   ?   ? ?Social Determinants of Health  ? ?Financial Resource Strain: Not on file  ?Food Insecurity: Not on file  ?Transportation Needs: Not on file  ?Physical Activity: Not on file  ?Stress: Not on file  ?Social Connections: Not on file  ?Intimate Partner Violence: Not on file  ? ?Current Meds  ?Medication Sig  ? Cholecalciferol (D3-1000 PO) Take 2,000 Units by mouth.  ? CINNAMON PO Take by mouth.  ? empagliflozin (JARDIANCE) 25 MG TABS tablet Take 1 tablet (25 mg total) by mouth daily before breakfast.  ? losartan (COZAAR) 100 MG tablet Take 1 tablet (100 mg total) by mouth daily. If combination pill comes in dispense to patient and let him know d/c hyzaar 100-25 since on backorder and advise pt  ? potassium chloride  SA (KLOR-CON M) 20 MEQ tablet Take 1 tablet (20 mEq total) by mouth daily. Kdur or formulation pharmacy has  ? [DISCONTINUED] hydrochlorothiazide (HYDRODIURIL) 25 MG tablet Take 1 tablet (25 mg total) by mouth daily. In the am  ? [DISCONTINUED] metFORMIN (GLUCOPHAGE) 500 MG tablet TAKE 1 TABLET (500 MG TOTAL) BY MOUTH 2 (TWO) TIMES DAILY WITH A MEAL.  ? [DISCONTINUED] pravastatin (PRAVACHOL) 10 MG tablet TAKE 1 TABLET (10 MG TOTAL) BY MOUTH DAILY AT BEDTIME  ? ?No Known Allergies ?No results found for this or any previous visit (from the past 2160 hour(s)). ?Objective  ?Body mass index is 46.12 kg/m?. ?Wt Readings from Last 3 Encounters:  ?02/14/22 (!) 321 lb 6.4 oz (145.8 kg)  ?08/16/21 (!) 324 lb 14.1 oz (147.4 kg)  ?11/02/20 (!) 332 lb 3.2 oz (150.7 kg)  ? ?Temp Readings from Last 3 Encounters:  ?02/14/22 98.2 ?F (36.8 ?C) (Oral)  ?08/16/21 98.4 ?F (36.9 ?C) (Oral)  ?11/02/20 97.8 ?F (36.6 ?C) (Oral)  ? ?BP Readings  from Last 3 Encounters:  ?02/14/22 130/86  ?08/16/21 110/70  ?11/02/20 136/80  ? ?Pulse Readings from Last 3 Encounters:  ?02/14/22 89  ?08/16/21 97  ?11/02/20 93  ? ? ?Physical Exam ?Vitals and nursing note reviewed.  ?Constitutional:   ?   Appearance: Normal appearance. He is well-developed and well-groomed.  ?HENT:  ?   Head: Normocephalic and atraumatic.  ?Eyes:  ?   Conjunctiva/sclera: Conjunctivae normal.  ?   Pupils: Pupils are equal, round, and reactive to light.  ?Cardiovascular:  ?   Rate and Rhythm: Normal rate and regular rhythm.  ?   Heart sounds: Normal heart sounds.  ?Pulmonary:  ?   Effort: Pulmonary effort is normal. No respiratory distress.  ?   Breath sounds: Normal breath sounds.  ?Abdominal:  ?   Tenderness: There is no abdominal tenderness.  ?Skin: ?   General: Skin is warm and moist.  ?Neurological:  ?   General: No focal deficit present.  ?   Mental Status: He is alert and oriented to person, place, and time. Mental status is at baseline.  ?   Sensory: Sensation is intact.  ?   Motor: Motor function is intact.  ?   Coordination: Coordination is intact.  ?   Gait: Gait is intact. Gait normal.  ?Psychiatric:     ?   Attention and Perception: Attention and perception normal.     ?   Mood and Affect: Mood and affect normal.     ?   Speech: Speech normal.     ?   Behavior: Behavior normal. Behavior is cooperative.     ?   Thought Content: Thought content normal.     ?   Cognition and Memory: Cognition and memory normal.     ?   Judgment: Judgment normal.  ? ? ?Assessment  ?Plan  ?Annual physical exam ?See below ? ?Abnormal thyroid function test - Plan: TSH, T4, free, Thyroid peroxidase antibody, T3, free ? ?Hypertension associated with diabetes ([place]) dm sl elevated last A1c 8.0 bp overall controlled- Plan: Comprehensive metabolic panel, Lipid panel, CBC with Differential/Platelet, Hemoglobin A1c ? jardiance 25 mg qd metformin 500 mg bid, losartan 100 mg qd and hctz 12.5 mg qd tolerating pravachol  10 mg qhs ?Foot exam today ?Call [facility] overdue eye exam last 04/2020 ?  ?HM ?Had flu shot utd  ?Tdap utd  ?covid 3/3 booster declines booster for now  ?pna 23 vaccine utd, consider  prevnar with next labs ?Consider shingrix vaccine in the future ?Protected hep B, MMR immune ?declines std check  ?  ?PSA nl and 05/03/21 0.57 ?colonoscopy sch 06/18/18 normal tortuous colon  ?-f/u in 5 years per GI  ?M. Uncle colon cancer  ?No need for dermatology referral now consider in future  ?Used to chew chewing tobacco   ?rec healthy diet and exercise ?  ?Provider: Dr. Olivia Mackie McLean-Scocuzza-Internal Medicine  ?

## 2022-02-17 LAB — THYROID PEROXIDASE ANTIBODY: Thyroperoxidase Ab SerPl-aCnc: <1 IU/mL (ref <9)

## 2022-04-25 ENCOUNTER — Encounter: Payer: Self-pay | Admitting: Internal Medicine

## 2022-04-25 LAB — HM DIABETES EYE EXAM

## 2022-07-27 ENCOUNTER — Other Ambulatory Visit: Payer: Self-pay | Admitting: Internal Medicine

## 2022-07-27 DIAGNOSIS — I152 Hypertension secondary to endocrine disorders: Secondary | ICD-10-CM

## 2022-07-27 DIAGNOSIS — E1159 Type 2 diabetes mellitus with other circulatory complications: Secondary | ICD-10-CM

## 2022-07-28 ENCOUNTER — Other Ambulatory Visit: Payer: Self-pay | Admitting: Internal Medicine

## 2022-07-28 DIAGNOSIS — E876 Hypokalemia: Secondary | ICD-10-CM

## 2022-07-28 DIAGNOSIS — I10 Essential (primary) hypertension: Secondary | ICD-10-CM

## 2022-07-28 DIAGNOSIS — E1159 Type 2 diabetes mellitus with other circulatory complications: Secondary | ICD-10-CM

## 2022-07-28 DIAGNOSIS — E119 Type 2 diabetes mellitus without complications: Secondary | ICD-10-CM

## 2022-07-28 MED ORDER — LOSARTAN POTASSIUM 100 MG PO TABS: 100.0000 mg | ORAL_TABLET | Freq: Every day | ORAL | 3 refills | Status: DC

## 2022-07-28 MED ORDER — METFORMIN HCL 500 MG PO TABS: 500.0000 mg | ORAL_TABLET | Freq: Two times a day (BID) | ORAL | 3 refills | Status: DC

## 2022-07-28 MED ORDER — EMPAGLIFLOZIN 25 MG PO TABS: 25.0000 mg | ORAL_TABLET | Freq: Every day | ORAL | 3 refills | Status: DC

## 2022-07-28 MED ORDER — POTASSIUM CHLORIDE CRYS ER 20 MEQ PO TBCR: 20.0000 meq | EXTENDED_RELEASE_TABLET | Freq: Every day | ORAL | 3 refills | Status: DC

## 2022-07-28 MED ORDER — PRAVASTATIN SODIUM 10 MG PO TABS: ORAL_TABLET | ORAL | 3 refills | Status: DC

## 2022-07-28 MED ORDER — HYDROCHLOROTHIAZIDE 25 MG PO TABS: 12.5000 mg | ORAL_TABLET | Freq: Every day | ORAL | 3 refills | Status: DC

## 2022-08-22 ENCOUNTER — Ambulatory Visit: Payer: Managed Care, Other (non HMO) | Admitting: Internal Medicine

## 2022-10-24 ENCOUNTER — Encounter: Payer: Self-pay | Admitting: Nurse Practitioner

## 2022-10-24 ENCOUNTER — Ambulatory Visit: Payer: Managed Care, Other (non HMO) | Admitting: Nurse Practitioner

## 2022-10-24 VITALS — BP 118/86 | HR 98 | Temp 98.0°F | Ht 70.0 in | Wt 317.8 lb

## 2022-10-24 DIAGNOSIS — I152 Hypertension secondary to endocrine disorders: Secondary | ICD-10-CM

## 2022-10-24 DIAGNOSIS — E1159 Type 2 diabetes mellitus with other circulatory complications: Secondary | ICD-10-CM | POA: Diagnosis not present

## 2022-10-24 DIAGNOSIS — Z6841 Body Mass Index (BMI) 40.0 and over, adult: Secondary | ICD-10-CM

## 2022-10-24 DIAGNOSIS — E559 Vitamin D deficiency, unspecified: Secondary | ICD-10-CM

## 2022-10-24 DIAGNOSIS — E1165 Type 2 diabetes mellitus with hyperglycemia: Secondary | ICD-10-CM | POA: Diagnosis not present

## 2022-10-24 DIAGNOSIS — Z0000 Encounter for general adult medical examination without abnormal findings: Secondary | ICD-10-CM

## 2022-10-24 NOTE — Progress Notes (Signed)
Jonathan Morrow, NP-C Phone: [phone]  Jonathan [name] is a 57 y.o. male who presents today for transfer of care. He has no complaints or new concerns today. He is doing well on all of his medications.   HYPERTENSION Disease Monitoring: Blood pressure range- Not checking at home Chest pain- No      Dyspnea- No Medications: Compliance- Losartan and HCTZ daily Lightheadedness- No   Edema- No  DIABETES Disease Monitoring: Blood Sugar ranges- Not checking at home Polyuria/phagia/dipsia- No      Optho- No Medications: Compliance- Metformin and Jardiance daily Hypoglycemic symptoms- No  HYPERLIPIDEMIA Disease Monitoring: See symptoms for Hypertension Medications: Compliance- Pravastatin daily Right upper quadrant pain- No  Muscle aches- No  Lab Results  Component Value Date   NA 135 02/14/2022   K 3.7 02/14/2022   CO2 23 02/14/2022   GLUCOSE 122 (H) 02/14/2022   BUN 20 02/14/2022   CREATININE 1.23 02/14/2022   CALCIUM 9.2 02/14/2022   Lab Results  Component Value Date   HGBA1C 7.9 (H) 02/14/2022   Lab Results  Component Value Date   CHOL 130 02/14/2022   HDL 41.20 02/14/2022   LDLCALC 69 02/14/2022   TRIG 100.0 02/14/2022   CHOLHDL 3 02/14/2022    Social History   Tobacco Use  Smoking Status Never  Smokeless Tobacco Former    Current Outpatient Medications on File Prior to Visit  Medication Sig Dispense Refill   Cholecalciferol (D3-1000 PO) Take 2,000 Units by mouth.     empagliflozin (JARDIANCE) 25 MG TABS tablet Take 1 tablet (25 mg total) by mouth daily before breakfast. 90 tablet 3   hydrochlorothiazide (HYDRODIURIL) 25 MG tablet Take 0.5 tablets (12.5 mg total) by mouth daily. In the am 45 tablet 3   losartan (COZAAR) 100 MG tablet Take 1 tablet (100 mg total) by mouth daily. If combination pill comes in dispense to patient and let him know d/c hyzaar 100-25 since on backorder and advise pt 90 tablet 3   metFORMIN (GLUCOPHAGE) 500 MG tablet Take 1  tablet (500 mg total) by mouth 2 (two) times daily with a meal. 180 tablet 3   potassium chloride SA (KLOR-CON M) 20 MEQ tablet Take 1 tablet (20 mEq total) by mouth daily. Kdur or formulation pharmacy has 90 tablet 3   pravastatin (PRAVACHOL) 10 MG tablet TAKE 1 TABLET (10 MG TOTAL) BY MOUTH DAILY AT BEDTIME 90 tablet 3   CINNAMON PO Take by mouth.     No current facility-administered medications on file prior to visit.     ROS see history of present illness  Objective  Physical Exam Vitals:   10/24/22 1350  BP: 118/86  Pulse: 98  Temp: 98 F (36.7 C)  SpO2: 95%    BP Readings from Last 3 Encounters:  10/24/22 118/86  02/14/22 130/86  08/16/21 110/70   Wt Readings from Last 3 Encounters:  10/24/22 (!) 317 lb 12.8 oz (144.2 kg)  02/14/22 (!) 321 lb 6.4 oz (145.8 kg)  08/16/21 (!) 324 lb 14.1 oz (147.4 kg)    Physical Exam Constitutional:      Appearance: Normal appearance.  HENT:     Head: Normocephalic.  Cardiovascular:     Rate and Rhythm: Normal rate and regular rhythm.     Heart sounds: Normal heart sounds.  Pulmonary:     Effort: Pulmonary effort is normal.     Breath sounds: Normal breath sounds.  Skin:    General: Skin is warm and dry.  Neurological:     Mental Status: He is alert.  Psychiatric:        Mood and Affect: Mood normal.        Behavior: Behavior normal.    Assessment/Plan: Please see individual problem list.  Type 2 diabetes mellitus with hyperglycemia, without long-term current use of insulin ([place]) Assessment & Plan: Will repeat A1c. Continue Metformin and Jardiance. Diet and exercise.    Orders: -     Hemoglobin A1c; Future -     Microalbumin / creatinine urine ratio; Future  Hypertension associated with diabetes [facility]) Assessment & Plan: Chronic. Stable on Losartan 100mg  daily and HCTZ 25mg  daily. Continue.   Orders: -     CBC with Differential/Platelet; Future -     Comprehensive metabolic panel; Future  Morbid obesity with  BMI of 45.0-49.9, adult [facility]) Assessment & Plan: Encouraged healthy diet and exercise.   Orders: -     Hemoglobin A1c; Future  Vitamin D deficiency -     VITAMIN D 25 Hydroxy (Vit-D Deficiency, Fractures); Future  Preventative health care -     CBC with Differential/Platelet; Future -     Comprehensive metabolic panel; Future -     Hemoglobin A1c; Future -     TSH; Future -     Lipid panel; Future -     Microalbumin / creatinine urine ratio; Future -     VITAMIN D 25 Hydroxy (Vit-D Deficiency, Fractures); Future   Return in about 4 months (around 02/22/2023) for Annual Exam. Please complete labs 2-3 days prior.   Jonathan Morrow, NP-C [place]

## 2022-10-24 NOTE — Assessment & Plan Note (Signed)
Encouraged healthy diet and exercise

## 2022-10-24 NOTE — Assessment & Plan Note (Signed)
Chronic. Stable on Losartan 100mg  daily and HCTZ 25mg  daily. Continue.

## 2022-10-24 NOTE — Assessment & Plan Note (Signed)
Will repeat A1c. Continue Metformin and Jardiance. Diet and exercise.

## 2023-02-24 ENCOUNTER — Other Ambulatory Visit (INDEPENDENT_AMBULATORY_CARE_PROVIDER_SITE_OTHER): Payer: Managed Care, Other (non HMO)

## 2023-02-24 DIAGNOSIS — Z6841 Body Mass Index (BMI) 40.0 and over, adult: Secondary | ICD-10-CM

## 2023-02-24 DIAGNOSIS — E1159 Type 2 diabetes mellitus with other circulatory complications: Secondary | ICD-10-CM

## 2023-02-24 DIAGNOSIS — Z0000 Encounter for general adult medical examination without abnormal findings: Secondary | ICD-10-CM | POA: Diagnosis not present

## 2023-02-24 DIAGNOSIS — I152 Hypertension secondary to endocrine disorders: Secondary | ICD-10-CM | POA: Diagnosis not present

## 2023-02-24 DIAGNOSIS — E559 Vitamin D deficiency, unspecified: Secondary | ICD-10-CM

## 2023-02-24 DIAGNOSIS — E1165 Type 2 diabetes mellitus with hyperglycemia: Secondary | ICD-10-CM

## 2023-02-24 LAB — CBC WITH DIFFERENTIAL/PLATELET
Basophils Absolute: 0 10*3/uL (ref 0.0–0.1)
Basophils Relative: 0.3 % (ref 0.0–3.0)
Eosinophils Absolute: 0.1 10*3/uL (ref 0.0–0.7)
Eosinophils Relative: 1.5 % (ref 0.0–5.0)
HCT: 49.5 % (ref 39.0–52.0)
Hemoglobin: 16.8 g/dL (ref 13.0–17.0)
Lymphocytes Relative: 28.1 % (ref 12.0–46.0)
Lymphs Abs: 2.2 10*3/uL (ref 0.7–4.0)
MCHC: 34 g/dL (ref 30.0–36.0)
MCV: 87.5 fl (ref 78.0–100.0)
Monocytes Absolute: 0.7 10*3/uL (ref 0.1–1.0)
Monocytes Relative: 8.7 % (ref 3.0–12.0)
Neutro Abs: 4.9 10*3/uL (ref 1.4–7.7)
Neutrophils Relative %: 61.4 % (ref 43.0–77.0)
Platelets: 286 10*3/uL (ref 150.0–400.0)
RBC: 5.65 Mil/uL (ref 4.22–5.81)
RDW: 13.9 % (ref 11.5–15.5)
WBC: 7.9 10*3/uL (ref 4.0–10.5)

## 2023-02-24 LAB — COMPREHENSIVE METABOLIC PANEL
ALT: 19 U/L (ref 0–53)
AST: 16 U/L (ref 0–37)
Albumin: 4.1 g/dL (ref 3.5–5.2)
Alkaline Phosphatase: 118 U/L — ABNORMAL HIGH (ref 39–117)
BUN: 22 mg/dL (ref 6–23)
CO2: 26 mEq/L (ref 19–32)
Calcium: 9.2 mg/dL (ref 8.4–10.5)
Chloride: 101 mEq/L (ref 96–112)
Creatinine, Ser: 1.26 mg/dL (ref 0.40–1.50)
GFR: 63.67 mL/min (ref >60.00)
Glucose, Bld: 161 mg/dL — ABNORMAL HIGH (ref 70–99)
Potassium: 4.3 mEq/L (ref 3.5–5.1)
Sodium: 136 mEq/L (ref 135–145)
Total Bilirubin: 0.6 mg/dL (ref 0.2–1.2)
Total Protein: 7.2 g/dL (ref 6.0–8.3)

## 2023-02-24 LAB — LIPID PANEL
Cholesterol: 134 mg/dL (ref 0–200)
HDL: 37.6 mg/dL — ABNORMAL LOW (ref >39.00)
LDL Cholesterol: 69 mg/dL (ref 0–99)
NonHDL: 96.3
Total CHOL/HDL Ratio: 4
Triglycerides: 136 mg/dL (ref 0.0–149.0)
VLDL: 27.2 mg/dL (ref 0.0–40.0)

## 2023-02-24 LAB — TSH: TSH: 5.18 u[IU]/mL (ref 0.35–5.50)

## 2023-02-24 LAB — VITAMIN D 25 HYDROXY (VIT D DEFICIENCY, FRACTURES): VITD: 40.29 ng/mL (ref 30.00–100.00)

## 2023-02-24 LAB — MICROALBUMIN / CREATININE URINE RATIO
Creatinine,U: 85 mg/dL
Microalb Creat Ratio: 0.9 mg/g (ref 0.0–30.0)
Microalb, Ur: 0.8 mg/dL (ref 0.0–1.9)

## 2023-02-24 LAB — HEMOGLOBIN A1C: Hgb A1c MFr Bld: 8.6 % — ABNORMAL HIGH (ref 4.6–6.5)

## 2023-02-26 NOTE — Progress Notes (Signed)
Bethanie Dicker, NP-C Phone: [phone]  Jonathan [name] is a 57 y.o. male who presents today for annual exam. He completed lab work prior and would like to review the results. He has no complaints or new concerns today.   Diet: Poor- eats a lot of potatoes, not a lot of vegetables, one soda per day Exercise: None Colonoscopy: 06/18/2018- 10 year recall Prostate cancer screening: 2022 Family history-  Prostate cancer: No  Colon cancer: No Sexually active: Yes Vaccines-   Flu: UTD  Tetanus: 04/21/2018  Shingles: Needs  COVID19: x 3 HIV screening: Deferred Hep C Screening: Negative Tobacco use: No Alcohol use: Occasionally- once a month, social occasions Illicit Drug use: No Dentist: Yes Ophthalmology: Yes   Social History   Tobacco Use  Smoking Status Never  Smokeless Tobacco Former    Current Outpatient Medications on File Prior to Visit  Medication Sig Dispense Refill   Cholecalciferol (D3-1000 PO) Take 2,000 Units by mouth.     empagliflozin (JARDIANCE) 25 MG TABS tablet Take 1 tablet (25 mg total) by mouth daily before breakfast. 90 tablet 3   hydrochlorothiazide (HYDRODIURIL) 25 MG tablet Take 0.5 tablets (12.5 mg total) by mouth daily. In the am 45 tablet 3   losartan (COZAAR) 100 MG tablet Take 1 tablet (100 mg total) by mouth daily. If combination pill comes in dispense to patient and let him know d/c hyzaar 100-25 since on backorder and advise pt 90 tablet 3   potassium chloride SA (KLOR-CON M) 20 MEQ tablet Take 1 tablet (20 mEq total) by mouth daily. Kdur or formulation pharmacy has 90 tablet 3   pravastatin (PRAVACHOL) 10 MG tablet TAKE 1 TABLET (10 MG TOTAL) BY MOUTH DAILY AT BEDTIME 90 tablet 3   No current facility-administered medications on file prior to visit.     ROS see history of present illness  Objective  Physical Exam Vitals:   02/27/23 0822  BP: 122/80  Pulse: 94  Temp: 97.8 F (36.6 C)  SpO2: 95%    BP Readings from Last 3  Encounters:  02/27/23 122/80  10/24/22 118/86  02/14/22 130/86   Wt Readings from Last 3 Encounters:  02/27/23 (!) 313 lb 9.6 oz (142.2 kg)  10/24/22 (!) 317 lb 12.8 oz (144.2 kg)  02/14/22 (!) 321 lb 6.4 oz (145.8 kg)    Physical Exam Constitutional:      General: He is not in acute distress.    Appearance: Normal appearance.  HENT:     Head: Normocephalic.     Right Ear: Tympanic membrane normal.     Left Ear: Tympanic membrane normal.     Nose: Nose normal.     Mouth/Throat:     Mouth: Mucous membranes are moist.     Pharynx: Oropharynx is clear.  Eyes:     Conjunctiva/sclera: Conjunctivae normal.     Pupils: Pupils are equal, round, and reactive to light.  Neck:     Thyroid: No thyromegaly.  Cardiovascular:     Rate and Rhythm: Normal rate and regular rhythm.     Heart sounds: Normal heart sounds.  Pulmonary:     Effort: Pulmonary effort is normal.     Breath sounds: Normal breath sounds.  Abdominal:     General: Abdomen is flat. Bowel sounds are normal.     Palpations: Abdomen is soft. There is no mass.     Tenderness: There is no abdominal tenderness.  Musculoskeletal:        General: Normal range  of motion.  Lymphadenopathy:     Cervical: No cervical adenopathy.  Skin:    General: Skin is warm and dry.     Findings: No rash.  Neurological:     General: No focal deficit present.     Mental Status: He is alert.  Psychiatric:        Mood and Affect: Mood normal.        Behavior: Behavior normal.    Assessment/Plan: Please see individual problem list.  Preventative health care Assessment & Plan: Physical exam complete. Lab results reviewed with patient. Colonoscopy- UTD. PSA screening due, patient will return for this since it was not in his recent lab work. Flu and Tetanus vaccines- UTD. Declined additional COVID vaccines. Shingles vaccine due- first dose given today in office. He will return in 2-6 months for the second dose. Recommended follow ups with  Dentist and Ophthalmology for annual exams. Counseled on healthy diet and beginning to exercise. Return to care in 6 months, sooner PRN.    Type 2 diabetes mellitus with hyperglycemia, without long-term current use of insulin ([facility]) Assessment & Plan: Chronic. A1c more elevated in recent lab work from 7.9 to 8.6. Will increase Metformin to 1000 mg twice daily. He will continue Jardiance 25 mg daily. Discussed starting on Ozempic or Mounjaro, he politely declined today. He would like to try increasing his current medications first and working on his diet. Counseled on healthy diet and exercise. Will monitor.   Orders: -     metFORMIN HCl; Take 1 tablet (1,000 mg total) by mouth 2 (two) times daily with a meal.  Dispense: 180 tablet; Refill: 3  Need for shingles vaccine -     Varicella-zoster vaccine IM   Return in about 6 months (around 08/30/2023) for Follow up.   Bethanie Dicker, NP-C [facility] [employer]

## 2023-02-27 ENCOUNTER — Encounter: Payer: Self-pay | Admitting: Nurse Practitioner

## 2023-02-27 ENCOUNTER — Encounter: Payer: Managed Care, Other (non HMO) | Admitting: Nurse Practitioner

## 2023-02-27 ENCOUNTER — Ambulatory Visit: Payer: Managed Care, Other (non HMO) | Admitting: Nurse Practitioner

## 2023-02-27 VITALS — BP 122/80 | HR 94 | Temp 97.8°F | Ht 70.0 in | Wt 313.6 lb

## 2023-02-27 DIAGNOSIS — Z7984 Long term (current) use of oral hypoglycemic drugs: Secondary | ICD-10-CM

## 2023-02-27 DIAGNOSIS — E1165 Type 2 diabetes mellitus with hyperglycemia: Secondary | ICD-10-CM | POA: Diagnosis not present

## 2023-02-27 DIAGNOSIS — Z23 Encounter for immunization: Secondary | ICD-10-CM

## 2023-02-27 DIAGNOSIS — Z0000 Encounter for general adult medical examination without abnormal findings: Secondary | ICD-10-CM

## 2023-02-27 MED ORDER — METFORMIN HCL 1000 MG PO TABS: 1000.0000 mg | ORAL_TABLET | Freq: Two times a day (BID) | ORAL | 3 refills | Status: DC

## 2023-02-27 NOTE — Assessment & Plan Note (Signed)
Chronic. A1c more elevated in recent lab work from 7.9 to 8.6. Will increase Metformin to 1000 mg twice daily. He will continue Jardiance 25 mg daily. Discussed starting on Ozempic or Mounjaro, he politely declined today. He would like to try increasing his current medications first and working on his diet. Counseled on healthy diet and exercise. Will monitor.

## 2023-02-27 NOTE — Assessment & Plan Note (Signed)
Physical exam complete. Lab results reviewed with patient. Colonoscopy- UTD. PSA screening due, patient will return for this since it was not in his recent lab work. Flu and Tetanus vaccines- UTD. Declined additional COVID vaccines. Shingles vaccine due- first dose given today in office. He will return in 2-6 months for the second dose. Recommended follow ups with Dentist and Ophthalmology for annual exams. Counseled on healthy diet and beginning to exercise. Return to care in 6 months, sooner PRN.

## 2023-05-27 ENCOUNTER — Encounter: Payer: Self-pay | Admitting: Nurse Practitioner

## 2023-05-28 ENCOUNTER — Ambulatory Visit: Payer: Managed Care, Other (non HMO) | Admitting: Nurse Practitioner

## 2023-05-29 ENCOUNTER — Ambulatory Visit (INDEPENDENT_AMBULATORY_CARE_PROVIDER_SITE_OTHER): Payer: Managed Care, Other (non HMO) | Admitting: Nurse Practitioner

## 2023-05-29 ENCOUNTER — Encounter: Payer: Self-pay | Admitting: Nurse Practitioner

## 2023-05-29 VITALS — BP 136/82 | HR 92 | Temp 97.6°F | Ht 70.0 in | Wt 313.2 lb

## 2023-05-29 DIAGNOSIS — R21 Rash and other nonspecific skin eruption: Secondary | ICD-10-CM

## 2023-05-29 MED ORDER — HYDROCORTISONE 2.5 % EX OINT: TOPICAL_OINTMENT | Freq: Two times a day (BID) | CUTANEOUS | 0 refills | Status: DC

## 2023-05-29 MED ORDER — PREDNISONE 10 MG PO TABS: ORAL_TABLET | ORAL | 0 refills | Status: DC

## 2023-05-29 NOTE — Assessment & Plan Note (Signed)
Will treat with prednisone tapering and hydrocortisone cream. Advised patient to take over-the-counter antihistamine once a day. Patient will let us know if the symptoms are not improving.

## 2023-05-29 NOTE — Progress Notes (Signed)
Established Patient Office Visit  Subjective:  Patient ID: Jonathan [name], male    DOB: December 02, 1965  Age: 57 y.o. MRN: [id number]  CC:  Chief Complaint  Patient presents with   Acute Visit    Red itchy bumps started on feet & legs now spreading    HPI  Jonathan [name] presents for red itchy bumps on the feet, legs, upper arm and at the neck after working in the yard.  Rash This is a new problem. The current episode started in the past 7 days. The problem has been gradually worsening since onset. The affected locations include the neck, left upper leg, left lower leg, left arm, right arm, right upper leg and right lower leg. The rash is characterized by itchiness and redness. He was exposed to plant contact and an insect bite/sting. Pertinent negatives include no cough, fatigue or sore throat. Treatments tried: antihistamine cream benadryl. There is no history of asthma or eczema.     Past Medical History:  Diagnosis Date   COVID-19    11/2020   Diabetes mellitus without complication (HCC)    Hyperlipidemia    Hypertension    Obesity    Sleep apnea     Past Surgical History:  Procedure Laterality Date   COLONOSCOPY WITH PROPOFOL N/A 06/18/2018   Procedure: COLONOSCOPY WITH PROPOFOL;  Surgeon: Pasty Spillers, MD;  Location: [facility] ENDOSCOPY;  Service: Endoscopy;  Laterality: N/A;   TONSILLECTOMY     age 63 or 4    WISDOM TOOTH EXTRACTION      Family History  Problem Relation Age of Onset   Cancer Mother        breast   Cancer Father        died 10-01-19 neuroendocrine tumor liver/stomach dx'ed age 50 y.o    CAD Father    Emphysema Father    Heart disease Father        CABG    Social History   Socioeconomic History   Marital status: Married    Spouse name: Not on file   Number of children: Not on file   Years of education: Not on file   Highest education level: Not on file  Occupational History   Not on file  Tobacco Use   Smoking status:  Never   Smokeless tobacco: Former  Advertising account planner   Vaping status: Never Used  Substance and Sexual Activity   Alcohol use: Yes    Comment: Occasionally   Drug use: No   Sexual activity: Yes    Birth control/protection: None  Other Topics Concern   Not on file  Social History Narrative   Married    2 sons age 41 and 66 as of 04/21/18    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    Former chewing tobacco   Owns guns, wears seat belts, safe in relationship    He is sole caretaker of both his parents       Social Determinants of Corporate investment banker Strain: Not on file  Food Insecurity: Not on file  Transportation Needs: Not on file  Physical Activity: Not on file  Stress: Not on file  Social Connections: Not on file  Intimate Partner Violence: Not on file     Outpatient Medications Prior to Visit  Medication Sig Dispense Refill   Cholecalciferol (D3-1000 PO) Take 2,000 Units by mouth.     empagliflozin (JARDIANCE) 25 MG TABS tablet Take 1  tablet (25 mg total) by mouth daily before breakfast. 90 tablet 3   hydrochlorothiazide (HYDRODIURIL) 25 MG tablet Take 0.5 tablets (12.5 mg total) by mouth daily. In the am 45 tablet 3   losartan (COZAAR) 100 MG tablet Take 1 tablet (100 mg total) by mouth daily. If combination pill comes in dispense to patient and let him know d/c hyzaar 100-25 since on backorder and advise pt 90 tablet 3   metFORMIN (GLUCOPHAGE) 1000 MG tablet Take 1 tablet (1,000 mg total) by mouth 2 (two) times daily with a meal. 180 tablet 3   potassium chloride SA (KLOR-CON M) 20 MEQ tablet Take 1 tablet (20 mEq total) by mouth daily. Kdur or formulation pharmacy has 90 tablet 3   pravastatin (PRAVACHOL) 10 MG tablet TAKE 1 TABLET (10 MG TOTAL) BY MOUTH DAILY AT BEDTIME 90 tablet 3   No facility-administered medications prior to visit.    No Known Allergies  ROS Review of Systems  Constitutional:  Negative for fatigue.  HENT:  Negative for sore throat.    Respiratory:  Negative for cough.   Skin:  Positive for rash.   Negative unless indicated in HPI.    Objective:    Physical Exam Constitutional:      Appearance: Normal appearance.  Cardiovascular:     Rate and Rhythm: Normal rate and regular rhythm.     Pulses: Normal pulses.     Heart sounds: Normal heart sounds.  Musculoskeletal:     Cervical back: Normal range of motion.  Skin:    General: Skin is warm.     Findings: Rash present. Rash is papular.     Comments: Small multiple scattered blanchable macules to bilateral legs, arms and few on the neck.  Neurological:     General: No focal deficit present.     Mental Status: He is alert. Mental status is at baseline.  Psychiatric:        Mood and Affect: Mood normal.        Behavior: Behavior normal.        Thought Content: Thought content normal.        Judgment: Judgment normal.     BP 136/82   Pulse 92   Temp 97.6 F (36.4 C)   Ht 5\' 10"  (1.778 m)   Wt (!) 313 lb 3.2 oz (142.1 kg)   SpO2 95%   BMI 44.94 kg/m  Wt Readings from Last 3 Encounters:  05/29/23 (!) 313 lb 3.2 oz (142.1 kg)  02/27/23 (!) 313 lb 9.6 oz (142.2 kg)  10/24/22 (!) 317 lb 12.8 oz (144.2 kg)     Health Maintenance  Topic Date Due   HIV Screening  Never done   COVID-19 Vaccine (4 - 2023-24 season) 06/20/2022   FOOT EXAM  02/15/2023   Zoster Vaccines- Shingrix (2 of 2) 04/24/2023   OPHTHALMOLOGY EXAM  04/26/2023   INFLUENZA VACCINE  01/18/2024 (Originally 05/21/2023)   HEMOGLOBIN A1C  08/27/2023   Diabetic kidney evaluation - eGFR measurement  02/24/2024   Diabetic kidney evaluation - Urine ACR  02/24/2024   DTaP/Tdap/Td (2 - Td or Tdap) 04/21/2028   Colonoscopy  06/18/2028   Hepatitis C Screening  Completed   HPV VACCINES  Aged Out    There are no preventive care reminders to display for this patient.  Lab Results  Component Value Date   TSH 5.18 02/24/2023   Lab Results  Component Value Date   WBC 7.9 02/24/2023   HGB  16.8  02/24/2023   HCT 49.5 02/24/2023   MCV 87.5 02/24/2023   PLT 286.0 02/24/2023   Lab Results  Component Value Date   NA 136 02/24/2023   K 4.3 02/24/2023   CO2 26 02/24/2023   GLUCOSE 161 (H) 02/24/2023   BUN 22 02/24/2023   CREATININE 1.26 02/24/2023   BILITOT 0.6 02/24/2023   ALKPHOS 118 (H) 02/24/2023   AST 16 02/24/2023   ALT 19 02/24/2023   PROT 7.2 02/24/2023   ALBUMIN 4.1 02/24/2023   CALCIUM 9.2 02/24/2023   GFR 63.67 02/24/2023   Lab Results  Component Value Date   CHOL 134 02/24/2023   Lab Results  Component Value Date   HDL 37.60 (L) 02/24/2023   Lab Results  Component Value Date   LDLCALC 69 02/24/2023   Lab Results  Component Value Date   TRIG 136.0 02/24/2023   Lab Results  Component Value Date   CHOLHDL 4 02/24/2023   Lab Results  Component Value Date   HGBA1C 8.6 (H) 02/24/2023      Assessment & Plan:  Rash Assessment & Plan: Will treat with prednisone tapering and hydrocortisone cream. Advised patient to take over-the-counter antihistamine once a day. Patient will let us know if the symptoms are not improving.    Other orders -     predniSONE; Take 4 tablets ( total 40 mg) by mouth for 2 days; take 3 tablets ( total 30 mg) by mouth for 2 days; take 2 tablets ( total 20 mg) by mouth for 1 day; take 1 tablet ( total 10 mg) by mouth for 1 day.  Dispense: 17 tablet; Refill: 0 -     Hydrocortisone; Apply topically 2 (two) times daily.  Dispense: 30 g; Refill: 0   Education provided for symptoms management on AVS either electronically or printed.    Follow-up: Return if symptoms worsen or fail to improve.   Kara Dies, NP

## 2023-05-29 NOTE — Patient Instructions (Signed)
Prednisone and hydrocortisone sent to the pharmacy. Take OTC antihistamine once a day. Please let us know if the symptoms not improving.

## 2023-06-14 ENCOUNTER — Other Ambulatory Visit: Payer: Self-pay | Admitting: Nurse Practitioner

## 2023-06-15 ENCOUNTER — Encounter: Payer: Self-pay | Admitting: Nurse Practitioner

## 2023-06-15 DIAGNOSIS — I10 Essential (primary) hypertension: Secondary | ICD-10-CM

## 2023-06-15 MED ORDER — HYDROCHLOROTHIAZIDE 25 MG PO TABS
12.5000 mg | ORAL_TABLET | Freq: Every day | ORAL | 3 refills | Status: DC
Start: 2023-06-15 — End: 2023-09-11

## 2023-06-15 NOTE — Telephone Encounter (Signed)
Spoke with pt and he stated that refill is not needed.

## 2023-06-21 ENCOUNTER — Encounter: Payer: Self-pay | Admitting: Nurse Practitioner

## 2023-06-21 DIAGNOSIS — E119 Type 2 diabetes mellitus without complications: Secondary | ICD-10-CM

## 2023-06-21 DIAGNOSIS — I10 Essential (primary) hypertension: Secondary | ICD-10-CM

## 2023-06-23 MED ORDER — PRAVASTATIN SODIUM 10 MG PO TABS
ORAL_TABLET | ORAL | 3 refills | Status: DC
Start: 2023-06-23 — End: 2024-06-13

## 2023-08-07 ENCOUNTER — Encounter: Payer: Self-pay | Admitting: Nurse Practitioner

## 2023-08-07 DIAGNOSIS — I10 Essential (primary) hypertension: Secondary | ICD-10-CM

## 2023-08-07 MED ORDER — LOSARTAN POTASSIUM 100 MG PO TABS
100.0000 mg | ORAL_TABLET | Freq: Every day | ORAL | 3 refills | Status: DC
Start: 2023-08-07 — End: 2024-05-23

## 2023-08-23 ENCOUNTER — Encounter: Payer: Self-pay | Admitting: Nurse Practitioner

## 2023-08-23 DIAGNOSIS — I152 Hypertension secondary to endocrine disorders: Secondary | ICD-10-CM

## 2023-08-24 MED ORDER — EMPAGLIFLOZIN 25 MG PO TABS
25.0000 mg | ORAL_TABLET | Freq: Every day | ORAL | 3 refills | Status: DC
Start: 2023-08-24 — End: 2024-07-22

## 2023-09-04 ENCOUNTER — Ambulatory Visit: Payer: Managed Care, Other (non HMO) | Admitting: Nurse Practitioner

## 2023-09-11 ENCOUNTER — Ambulatory Visit (INDEPENDENT_AMBULATORY_CARE_PROVIDER_SITE_OTHER): Payer: Managed Care, Other (non HMO) | Admitting: Nurse Practitioner

## 2023-09-11 ENCOUNTER — Encounter: Payer: Self-pay | Admitting: Nurse Practitioner

## 2023-09-11 VITALS — BP 128/80 | HR 104 | Temp 98.0°F | Ht 70.0 in | Wt 316.6 lb

## 2023-09-11 DIAGNOSIS — Z7984 Long term (current) use of oral hypoglycemic drugs: Secondary | ICD-10-CM

## 2023-09-11 DIAGNOSIS — E1169 Type 2 diabetes mellitus with other specified complication: Secondary | ICD-10-CM | POA: Insufficient documentation

## 2023-09-11 DIAGNOSIS — E876 Hypokalemia: Secondary | ICD-10-CM

## 2023-09-11 DIAGNOSIS — I152 Hypertension secondary to endocrine disorders: Secondary | ICD-10-CM

## 2023-09-11 DIAGNOSIS — Z23 Encounter for immunization: Secondary | ICD-10-CM | POA: Diagnosis not present

## 2023-09-11 DIAGNOSIS — E1165 Type 2 diabetes mellitus with hyperglycemia: Secondary | ICD-10-CM

## 2023-09-11 DIAGNOSIS — E1159 Type 2 diabetes mellitus with other circulatory complications: Secondary | ICD-10-CM

## 2023-09-11 DIAGNOSIS — E785 Hyperlipidemia, unspecified: Secondary | ICD-10-CM

## 2023-09-11 DIAGNOSIS — Z6841 Body Mass Index (BMI) 40.0 and over, adult: Secondary | ICD-10-CM

## 2023-09-11 LAB — POCT GLYCOSYLATED HEMOGLOBIN (HGB A1C): Hemoglobin A1C: 8 % — AB (ref 4.0–5.6)

## 2023-09-11 MED ORDER — POTASSIUM CHLORIDE CRYS ER 20 MEQ PO TBCR
20.0000 meq | EXTENDED_RELEASE_TABLET | Freq: Every day | ORAL | 3 refills | Status: DC
Start: 2023-09-11 — End: 2024-07-22

## 2023-09-11 MED ORDER — HYDROCHLOROTHIAZIDE 25 MG PO TABS
12.5000 mg | ORAL_TABLET | Freq: Every day | ORAL | 3 refills | Status: DC
Start: 2023-09-11 — End: 2024-07-22

## 2023-09-11 NOTE — Assessment & Plan Note (Signed)
Their last A1c was 8.6 without symptoms of hyperglycemia or hypoglycemia. They are on Metformin 1000mg  BID and Jardiance 25 mg daily, with limited progress from diet and exercise due to their work schedule. We will check A1c today- POCT A1c- 8.0, which is down from 8.6. We will continue current medications, and have discussed the importance of diet in managing blood sugar levels.

## 2023-09-11 NOTE — Assessment & Plan Note (Signed)
Their hypertension is well controlled on Hydrochlorothiazide 12.5 mg daily and Losartan 100 mg daily without symptoms of chest pain, shortness of breath, dizziness, or swelling. We will continue current medications.

## 2023-09-11 NOTE — Assessment & Plan Note (Addendum)
Their cholesterol is well controlled on Pravastatin 10 mg daily. They have no symptoms of abdominal pain or muscle aches. We will continue the current medication. The 10-year ASCVD risk score (Arnett DK, et al., 2019) is: 12.1%.

## 2023-09-11 NOTE — Progress Notes (Signed)
Bethanie Dicker, NP-C Phone: [phone]  Jonathan [name] is a 57 y.o. male who presents today for follow up.   Discussed the use of AI scribe software for clinical note transcription with the patient, who gave verbal consent to proceed.  History of Present Illness   The patient, with a history of diabetes, hypertension, and hyperlipidemia, presents for a six-month follow-up. They report no issues with excessive thirst or urination, and deny any symptoms of hypoglycemia. The patient is currently on Metformin 1000mg  twice daily and Jardiance, with no reported side effects. However, their last recorded HbA1c was elevated at 8.6.  Despite acknowledging the importance of diet and exercise in managing their diabetes, the patient admits to struggling with these lifestyle modifications due to long work hours. They express a lack of time and energy for exercise and dietary changes.  The patient is also on Hydrochlorothiazide and Losartan for hypertension, and Pravastatin for hyperlipidemia. They deny any chest pain, shortness of breath, dizziness, swelling, abdominal pain, or new muscle aches. They do not monitor their blood sugar or blood pressure at home.  In the past, there was a discussion about starting a once-weekly injectable medication for diabetes, but this was not pursued due to a family history of thyroid cancer. The patient's father had thyroid cancer, and it was decided not to proceed with the injectable medication due to this history.      Social History   Tobacco Use  Smoking Status Never  Smokeless Tobacco Former    Current Outpatient Medications on File Prior to Visit  Medication Sig Dispense Refill   Cholecalciferol (D3-1000 PO) Take 2,000 Units by mouth.     empagliflozin (JARDIANCE) 25 MG TABS tablet Take 1 tablet (25 mg total) by mouth daily before breakfast. 90 tablet 3   losartan (COZAAR) 100 MG tablet Take 1 tablet (100 mg total) by mouth daily. If combination pill  comes in dispense to patient and let him know d/c hyzaar 100-25 since on backorder and advise pt 90 tablet 3   metFORMIN (GLUCOPHAGE) 1000 MG tablet Take 1 tablet (1,000 mg total) by mouth 2 (two) times daily with a meal. 180 tablet 3   pravastatin (PRAVACHOL) 10 MG tablet TAKE 1 TABLET (10 MG TOTAL) BY MOUTH DAILY AT BEDTIME 90 tablet 3   No current facility-administered medications on file prior to visit.    ROS see history of present illness  Objective  Physical Exam Vitals:   09/11/23 0836  BP: 128/80  Pulse: (!) 104  Temp: 98 F (36.7 C)  SpO2: 96%    BP Readings from Last 3 Encounters:  09/11/23 128/80  05/29/23 136/82  02/27/23 122/80   Wt Readings from Last 3 Encounters:  09/11/23 (!) 316 lb 9.6 oz (143.6 kg)  05/29/23 (!) 313 lb 3.2 oz (142.1 kg)  02/27/23 (!) 313 lb 9.6 oz (142.2 kg)    Physical Exam Constitutional:      General: He is not in acute distress.    Appearance: Normal appearance.  HENT:     Head: Normocephalic.  Cardiovascular:     Rate and Rhythm: Normal rate and regular rhythm.     Heart sounds: Normal heart sounds.  Pulmonary:     Effort: Pulmonary effort is normal.     Breath sounds: Normal breath sounds.  Skin:    General: Skin is warm and dry.  Neurological:     General: No focal deficit present.     Mental Status: He is alert.  Psychiatric:  Mood and Affect: Mood normal.        Behavior: Behavior normal.    Assessment/Plan: Please see individual problem list.  Type 2 diabetes mellitus with hyperglycemia, without long-term current use of insulin (HCC) Assessment & Plan: Their last A1c was 8.6 without symptoms of hyperglycemia or hypoglycemia. They are on Metformin 1000mg  BID and Jardiance 25 mg daily, with limited progress from diet and exercise due to their work schedule. We will check A1c today- POCT A1c- 8.0, which is down from 8.6. We will continue current medications, and have discussed the importance of diet in managing  blood sugar levels.   Orders: -     POCT glycosylated hemoglobin (Hb A1C)  Hypertension associated with diabetes [facility]) Assessment & Plan: Their hypertension is well controlled on Hydrochlorothiazide 12.5 mg daily and Losartan 100 mg daily without symptoms of chest pain, shortness of breath, dizziness, or swelling. We will continue current medications.  Orders: -     hydroCHLOROthiazide; Take 0.5 tablets (12.5 mg total) by mouth daily. In the am  Dispense: 45 tablet; Refill: 3  Hyperlipidemia associated with type 2 diabetes mellitus (HCC) Assessment & Plan: Their cholesterol is well controlled on Pravastatin 10 mg daily. They have no symptoms of abdominal pain or muscle aches. We will continue the current medication. The 10-year ASCVD risk score (Arnett DK, et al., 2019) is: 12.1%.    Morbid obesity with BMI of 45.0-49.9, adult [facility]) Assessment & Plan: Encouraged healthy diet and exercise.    Hypokalemia -     Potassium Chloride Crys ER; Take 1 tablet (20 mEq total) by mouth daily.  Dispense: 90 tablet; Refill: 3  Need for shingles vaccine -     Varicella-zoster vaccine IM   Return in about 6 months (around 03/10/2024) for Follow up.   Bethanie Dicker, NP-C [facility] - [employer]

## 2023-09-11 NOTE — Assessment & Plan Note (Signed)
Encouraged healthy diet and exercise.

## 2023-12-13 ENCOUNTER — Other Ambulatory Visit: Payer: Self-pay | Admitting: Nurse Practitioner

## 2023-12-13 DIAGNOSIS — E1165 Type 2 diabetes mellitus with hyperglycemia: Secondary | ICD-10-CM

## 2024-03-25 ENCOUNTER — Ambulatory Visit: Payer: Managed Care, Other (non HMO) | Admitting: Nurse Practitioner

## 2024-03-25 VITALS — BP 118/82 | HR 91 | Temp 98.4°F | Ht 70.0 in | Wt 316.2 lb

## 2024-03-25 DIAGNOSIS — E1169 Type 2 diabetes mellitus with other specified complication: Secondary | ICD-10-CM | POA: Diagnosis not present

## 2024-03-25 DIAGNOSIS — I152 Hypertension secondary to endocrine disorders: Secondary | ICD-10-CM | POA: Diagnosis not present

## 2024-03-25 DIAGNOSIS — E559 Vitamin D deficiency, unspecified: Secondary | ICD-10-CM

## 2024-03-25 DIAGNOSIS — E1159 Type 2 diabetes mellitus with other circulatory complications: Secondary | ICD-10-CM | POA: Diagnosis not present

## 2024-03-25 DIAGNOSIS — E785 Hyperlipidemia, unspecified: Secondary | ICD-10-CM

## 2024-03-25 DIAGNOSIS — Z125 Encounter for screening for malignant neoplasm of prostate: Secondary | ICD-10-CM | POA: Diagnosis not present

## 2024-03-25 DIAGNOSIS — Z7984 Long term (current) use of oral hypoglycemic drugs: Secondary | ICD-10-CM

## 2024-03-25 DIAGNOSIS — Z1329 Encounter for screening for other suspected endocrine disorder: Secondary | ICD-10-CM | POA: Diagnosis not present

## 2024-03-25 DIAGNOSIS — E1165 Type 2 diabetes mellitus with hyperglycemia: Secondary | ICD-10-CM | POA: Diagnosis not present

## 2024-03-25 DIAGNOSIS — Z0000 Encounter for general adult medical examination without abnormal findings: Secondary | ICD-10-CM

## 2024-03-25 DIAGNOSIS — Z6841 Body Mass Index (BMI) 40.0 and over, adult: Secondary | ICD-10-CM

## 2024-03-25 LAB — CBC WITH DIFFERENTIAL/PLATELET
Basophils Absolute: 0 10*3/uL (ref 0.0–0.1)
Basophils Relative: 0.5 % (ref 0.0–3.0)
Eosinophils Absolute: 0.1 10*3/uL (ref 0.0–0.7)
Eosinophils Relative: 2 % (ref 0.0–5.0)
HCT: 47.9 % (ref 39.0–52.0)
Hemoglobin: 16.2 g/dL (ref 13.0–17.0)
Lymphocytes Relative: 27.9 % (ref 12.0–46.0)
Lymphs Abs: 2.1 10*3/uL (ref 0.7–4.0)
MCHC: 33.7 g/dL (ref 30.0–36.0)
MCV: 84.7 fl (ref 78.0–100.0)
Monocytes Absolute: 0.6 10*3/uL (ref 0.1–1.0)
Monocytes Relative: 8.4 % (ref 3.0–12.0)
Neutro Abs: 4.6 10*3/uL (ref 1.4–7.7)
Neutrophils Relative %: 61.2 % (ref 43.0–77.0)
Platelets: 286 10*3/uL (ref 150.0–400.0)
RBC: 5.65 Mil/uL (ref 4.22–5.81)
RDW: 13.8 % (ref 11.5–15.5)
WBC: 7.5 10*3/uL (ref 4.0–10.5)

## 2024-03-25 LAB — MICROALBUMIN / CREATININE URINE RATIO
Creatinine,U: 78.2 mg/dL
Microalb Creat Ratio: 19.6 mg/g (ref 0.0–30.0)
Microalb, Ur: 1.5 mg/dL (ref 0.0–1.9)

## 2024-03-25 LAB — LIPID PANEL
Cholesterol: 137 mg/dL (ref 0–200)
HDL: 41 mg/dL (ref >39.00)
LDL Cholesterol: 67 mg/dL (ref 0–99)
NonHDL: 96.13
Total CHOL/HDL Ratio: 3
Triglycerides: 145 mg/dL (ref 0.0–149.0)
VLDL: 29 mg/dL (ref 0.0–40.0)

## 2024-03-25 LAB — COMPREHENSIVE METABOLIC PANEL WITH GFR
ALT: 20 U/L (ref 0–53)
AST: 15 U/L (ref 0–37)
Albumin: 4.3 g/dL (ref 3.5–5.2)
Alkaline Phosphatase: 115 U/L (ref 39–117)
BUN: 20 mg/dL (ref 6–23)
CO2: 27 meq/L (ref 19–32)
Calcium: 9.4 mg/dL (ref 8.4–10.5)
Chloride: 100 meq/L (ref 96–112)
Creatinine, Ser: 1.32 mg/dL (ref 0.40–1.50)
GFR: 59.76 mL/min — ABNORMAL LOW (ref >60.00)
Glucose, Bld: 138 mg/dL — ABNORMAL HIGH (ref 70–99)
Potassium: 4.5 meq/L (ref 3.5–5.1)
Sodium: 137 meq/L (ref 135–145)
Total Bilirubin: 0.7 mg/dL (ref 0.2–1.2)
Total Protein: 7 g/dL (ref 6.0–8.3)

## 2024-03-25 LAB — HEMOGLOBIN A1C: Hgb A1c MFr Bld: 8.3 % — ABNORMAL HIGH (ref 4.6–6.5)

## 2024-03-25 NOTE — Progress Notes (Signed)
 Bluford Burkitt, NP-C Phone: [phone]  Jonathan [name] is a 58 y.o. male who presents today for annual exam.   Discussed the use of AI scribe software for clinical note transcription with the patient, who gave verbal consent to proceed.  History of Present Illness   Jonathan [name] is a 58 year old male who presents for an annual physical exam.  He has a history of type 2 diabetes and is currently taking metformin  and Jardiance . He does not regularly monitor his blood glucose levels at home. His last recorded hemoglobin A1c was 8.0%. His diet is high in carbohydrates, including breads, potatoes, and pastas, and he consumes a couple of sodas daily. He drinks four to five bottles of water each day.  He is on losartan , pravastatin , and hydrochlorothiazide  for hypertension and hyperlipidemia. He also takes potassium due to the hydrochlorothiazide .  In terms of social history, he does not smoke and drinks alcohol a couple of times a month. He describes his job as 'pretty active,' involving a lot of walking and some desk work. On weekends, he engages in activities like mowing and outdoor work. He does not exercise regularly, jokingly asking 'Exercise, what's that?'  He reports irregular sleep patterns, often falling asleep on the couch after work and then having difficulty sleeping through the night. He wakes up early at 5 AM and feels fine despite this pattern.  No chest pain, shortness of breath, abdominal pain, urinary issues, headaches, dizziness, joint pain, mood problems, or skin changes. He notes slight swelling in his legs but describes it as 'not too bad.'      Social History   Tobacco Use  Smoking Status Never  Smokeless Tobacco Former    Current Outpatient Medications on File Prior to Visit  Medication Sig Dispense Refill   Cholecalciferol (D3-1000 PO) Take 2,000 Units by mouth.     empagliflozin  (JARDIANCE ) 25 MG TABS tablet Take 1 tablet (25 mg total) by mouth  daily before breakfast. 90 tablet 3   hydrochlorothiazide  (HYDRODIURIL ) 25 MG tablet Take 0.5 tablets (12.5 mg total) by mouth daily. In the am 45 tablet 3   losartan  (COZAAR ) 100 MG tablet Take 1 tablet (100 mg total) by mouth daily. If combination pill comes in dispense to patient and let him know d/c hyzaar 100-25 since on backorder and advise pt 90 tablet 3   metFORMIN  (GLUCOPHAGE ) 1000 MG tablet TAKE 1 TABLET (1,000 MG TOTAL) BY MOUTH TWICE A DAY WITH FOOD 180 tablet 3   potassium chloride  SA (KLOR-CON  M) 20 MEQ tablet Take 1 tablet (20 mEq total) by mouth daily. 90 tablet 3   pravastatin  (PRAVACHOL ) 10 MG tablet TAKE 1 TABLET (10 MG TOTAL) BY MOUTH DAILY AT BEDTIME 90 tablet 3   No current facility-administered medications on file prior to visit.     ROS see history of present illness  Objective  Physical Exam Vitals:   03/25/24 0838  BP: 118/82  Pulse: 91  Temp: 98.4 F (36.9 C)  SpO2: 95%    BP Readings from Last 3 Encounters:  03/25/24 118/82  09/11/23 128/80  05/29/23 136/82   Wt Readings from Last 3 Encounters:  03/25/24 (!) 316 lb 3.2 oz (143.4 kg)  09/11/23 (!) 316 lb 9.6 oz (143.6 kg)  05/29/23 (!) 313 lb 3.2 oz (142.1 kg)    Physical Exam Constitutional:      General: He is not in acute distress.    Appearance: Normal appearance. He is obese.  HENT:  Head: Normocephalic.     Right Ear: Tympanic membrane normal.     Left Ear: Tympanic membrane normal.     Nose: Nose normal.     Mouth/Throat:     Mouth: Mucous membranes are moist.     Pharynx: Oropharynx is clear.   Eyes:     Conjunctiva/sclera: Conjunctivae normal.     Pupils: Pupils are equal, round, and reactive to light.   Neck:     Thyroid : No thyromegaly.   Cardiovascular:     Rate and Rhythm: Normal rate and regular rhythm.     Heart sounds: Normal heart sounds.  Pulmonary:     Effort: Pulmonary effort is normal.     Breath sounds: Normal breath sounds.  Abdominal:     General:  Abdomen is flat. Bowel sounds are normal.     Palpations: Abdomen is soft. There is no mass.     Tenderness: There is no abdominal tenderness.   Musculoskeletal:        General: Normal range of motion.  Lymphadenopathy:     Cervical: No cervical adenopathy.   Skin:    General: Skin is warm and dry.     Findings: No rash.   Neurological:     General: No focal deficit present.     Mental Status: He is alert.   Psychiatric:        Mood and Affect: Mood normal.        Behavior: Behavior normal.      Assessment/Plan: Please see individual problem list.  Preventative health care Assessment & Plan: Physical exam complete. We will order routine lab work as outlined. PSA screening today in labs. Colonoscopy is up to date. Flu and tetanus vaccines are up to date. Completed Shingles vaccine series. He declines additional COVID vaccines. He has a sedentary job, some weekend activity, a poor diet, and irregular sleep. Encourage regular physical activity and advise on establishing a consistent sleep routine. Continue regular dental and eye exams. Return to care in 3 months.    Type 2 diabetes mellitus with hyperglycemia, without long-term current use of insulin (HCC) Assessment & Plan: HbA1c is at 8.0%, with a target of 7.0% or below. Current treatment includes metformin  and Jardiance . High carbohydrate intake and soda consumption are noted. Order an HbA1c test. Discuss the potential addition of medication if HbA1c remains above 7.0%. Advise dietary modifications to reduce carbohydrate intake and soda consumption.  Orders: -     Hemoglobin A1c -     Microalbumin / creatinine urine ratio  Hypertension associated with diabetes (HCC) Assessment & Plan: Blood pressure is well-controlled at 118/82 mmHg with losartan  and hydrochlorothiazide . Continue the current antihypertensive regimen. Check lab work as outlined.   Orders: -     Comprehensive metabolic panel with GFR -     CBC with  Differential/Platelet  Hyperlipidemia associated with type 2 diabetes mellitus (HCC) Assessment & Plan: Managed with pravastatin . Continue pravastatin  and check lipid panel.   Orders: -     Lipid panel  Morbid obesity with BMI of 45.0-49.9, adult [facility]) Assessment & Plan: Encourage healthy diet and exercise.    Vitamin D  deficiency -     VITAMIN D  25 Hydroxy (Vit-D Deficiency, Fractures)  Screening PSA (prostate specific antigen) -     PSA  Thyroid  disorder screen -     TSH     Return in about 3 months (around 06/25/2024) for Follow up.   Bluford Burkitt, NP-C [facility] - [facility]

## 2024-03-31 LAB — TSH: TSH: 3.23 u[IU]/mL (ref 0.35–5.50)

## 2024-03-31 LAB — VITAMIN D 25 HYDROXY (VIT D DEFICIENCY, FRACTURES): VITD: 37.35 ng/mL (ref 30.00–100.00)

## 2024-03-31 LAB — PSA: PSA: 0.67 ng/mL (ref 0.10–4.00)

## 2024-04-04 ENCOUNTER — Encounter: Payer: Self-pay | Admitting: Nurse Practitioner

## 2024-04-04 NOTE — Assessment & Plan Note (Signed)
 Blood pressure is well-controlled at 118/82 mmHg with losartan  and hydrochlorothiazide . Continue the current antihypertensive regimen. Check lab work as outlined.

## 2024-04-04 NOTE — Assessment & Plan Note (Signed)
 Managed with pravastatin . Continue pravastatin  and check lipid panel.

## 2024-04-04 NOTE — Assessment & Plan Note (Signed)
 HbA1c is at 8.0%, with a target of 7.0% or below. Current treatment includes metformin  and Jardiance . High carbohydrate intake and soda consumption are noted. Order an HbA1c test. Discuss the potential addition of medication if HbA1c remains above 7.0%. Advise dietary modifications to reduce carbohydrate intake and soda consumption.

## 2024-04-04 NOTE — Assessment & Plan Note (Signed)
 Physical exam complete. We will order routine lab work as outlined. PSA screening today in labs. Colonoscopy is up to date. Flu and tetanus vaccines are up to date. Completed Shingles vaccine series. He declines additional COVID vaccines. He has a sedentary job, some weekend activity, a poor diet, and irregular sleep. Encourage regular physical activity and advise on establishing a consistent sleep routine. Continue regular dental and eye exams. Return to care in 3 months.

## 2024-04-04 NOTE — Assessment & Plan Note (Signed)
Encourage healthy diet and exercise

## 2024-04-05 ENCOUNTER — Ambulatory Visit: Payer: Self-pay | Admitting: Nurse Practitioner

## 2024-04-05 ENCOUNTER — Other Ambulatory Visit: Payer: Self-pay | Admitting: Nurse Practitioner

## 2024-04-05 DIAGNOSIS — E1165 Type 2 diabetes mellitus with hyperglycemia: Secondary | ICD-10-CM

## 2024-04-05 MED ORDER — GLIPIZIDE 5 MG PO TABS: 5.0000 mg | ORAL_TABLET | Freq: Every day | ORAL | 1 refills | Status: DC

## 2024-05-03 ENCOUNTER — Telehealth: Payer: Self-pay | Admitting: Nurse Practitioner

## 2024-05-03 NOTE — Telephone Encounter (Signed)
 LMOM and mychart message to let pt know they need to call back and reschedule appt from 9/12. [facility]  [id number], please reschedule this pt's follow-up visit.

## 2024-05-21 ENCOUNTER — Other Ambulatory Visit: Payer: Self-pay | Admitting: Nurse Practitioner

## 2024-05-21 DIAGNOSIS — I10 Essential (primary) hypertension: Secondary | ICD-10-CM

## 2024-06-12 ENCOUNTER — Other Ambulatory Visit: Payer: Self-pay | Admitting: Nurse Practitioner

## 2024-06-12 DIAGNOSIS — E119 Type 2 diabetes mellitus without complications: Secondary | ICD-10-CM

## 2024-06-12 DIAGNOSIS — I10 Essential (primary) hypertension: Secondary | ICD-10-CM

## 2024-07-01 ENCOUNTER — Ambulatory Visit: Admitting: Nurse Practitioner

## 2024-07-22 ENCOUNTER — Ambulatory Visit (INDEPENDENT_AMBULATORY_CARE_PROVIDER_SITE_OTHER): Admitting: Nurse Practitioner

## 2024-07-22 ENCOUNTER — Encounter: Payer: Self-pay | Admitting: Nurse Practitioner

## 2024-07-22 VITALS — BP 124/86 | HR 92 | Temp 98.0°F | Ht 70.0 in | Wt 321.0 lb

## 2024-07-22 DIAGNOSIS — E1159 Type 2 diabetes mellitus with other circulatory complications: Secondary | ICD-10-CM

## 2024-07-22 DIAGNOSIS — E1165 Type 2 diabetes mellitus with hyperglycemia: Secondary | ICD-10-CM

## 2024-07-22 DIAGNOSIS — E876 Hypokalemia: Secondary | ICD-10-CM

## 2024-07-22 DIAGNOSIS — E1169 Type 2 diabetes mellitus with other specified complication: Secondary | ICD-10-CM

## 2024-07-22 DIAGNOSIS — I152 Hypertension secondary to endocrine disorders: Secondary | ICD-10-CM

## 2024-07-22 DIAGNOSIS — Z23 Encounter for immunization: Secondary | ICD-10-CM | POA: Diagnosis not present

## 2024-07-22 DIAGNOSIS — Z7984 Long term (current) use of oral hypoglycemic drugs: Secondary | ICD-10-CM | POA: Diagnosis not present

## 2024-07-22 DIAGNOSIS — E785 Hyperlipidemia, unspecified: Secondary | ICD-10-CM

## 2024-07-22 LAB — POCT GLYCOSYLATED HEMOGLOBIN (HGB A1C)
HbA1c POC (<> result, manual entry): 7.3 % (ref 4.0–5.6)
HbA1c, POC (controlled diabetic range): 7.3 % — AB (ref 0.0–7.0)
HbA1c, POC (prediabetic range): 7.3 % — AB (ref 5.7–6.4)
Hemoglobin A1C: 7.3 % — AB (ref 4.0–5.6)

## 2024-07-22 MED ORDER — POTASSIUM CHLORIDE CRYS ER 20 MEQ PO TBCR: 20.0000 meq | EXTENDED_RELEASE_TABLET | Freq: Every day | ORAL | 3 refills | Status: AC

## 2024-07-22 MED ORDER — HYDROCHLOROTHIAZIDE 25 MG PO TABS: 12.5000 mg | ORAL_TABLET | Freq: Every day | ORAL | 3 refills | Status: AC

## 2024-07-22 MED ORDER — GLIPIZIDE 10 MG PO TABS: 10.0000 mg | ORAL_TABLET | Freq: Every day | ORAL | 3 refills | Status: AC

## 2024-07-22 MED ORDER — EMPAGLIFLOZIN 25 MG PO TABS: 25.0000 mg | ORAL_TABLET | Freq: Every day | ORAL | 3 refills | Status: AC

## 2024-07-22 NOTE — Assessment & Plan Note (Signed)
 Blood pressure is well controlled with losartan  and hydrochlorothiazide , with no symptoms reported. He does not self-monitor his blood pressure. Continue losartan  and hydrochlorothiazide .

## 2024-07-22 NOTE — Progress Notes (Signed)
 Leron Glance, NP-C Phone: [phone]  Jonathan [name] is a 58 y.o. male who presents today for follow up.  Discussed the use of AI scribe software for clinical note transcription with the patient, who gave verbal consent to proceed.  History of Present Illness   Kendrew Dean Ratliff Dean is a 58 year old male with type 2 diabetes mellitus who presents for a follow-up on blood sugar control.  He does not regularly monitor his blood sugar at home. He is currently taking Jardiance , glipizide , and metformin  for diabetes management. His last recorded A1c was 8.3%. No symptoms of excessive thirst or urination, although he is trying to increase his water intake. He has not experienced any episodes of hypoglycemia that he is aware of.  Regarding his diet, he acknowledges it is suboptimal and admits he is not as mindful about it as he should be. He does not monitor his blood pressure at home but takes losartan  and hydrochlorothiazide  daily for hypertension. No symptoms of chest pain, shortness of breath, dizziness, or edema. He also takes pravastatin  for cholesterol management and supplements with potassium and vitamin D .  He received a flu shot recently and sees an eye doctor, although he is unsure of the last visit date as he does not receive reminders.      Social History   Tobacco Use  Smoking Status Never  Smokeless Tobacco Former    Current Outpatient Medications on File Prior to Visit  Medication Sig Dispense Refill   Cholecalciferol (D3-1000 PO) Take 2,000 Units by mouth.     losartan  (COZAAR ) 100 MG tablet TAKE 1 TABLET (100 MG TOTAL) BY MOUTH DAILY. IF COMBINATION PILL COMES IN DISPENSE TO PATIENT AND LET HIM KNOW. STOP LOSARTAN  100-25 SINCE ON BACKORDER AND ADVISE PATIENT 90 tablet 3   metFORMIN  (GLUCOPHAGE ) 1000 MG tablet TAKE 1 TABLET (1,000 MG TOTAL) BY MOUTH TWICE A DAY WITH FOOD 180 tablet 3   pravastatin  (PRAVACHOL ) 10 MG tablet TAKE 1 TABLET BY MOUTH DAILY AT BEDTIME  90 tablet 3   No current facility-administered medications on file prior to visit.     ROS see history of present illness  Objective  Physical Exam Vitals:   07/22/24 0815  BP: 124/86  Pulse: 92  Temp: 98 F (36.7 C)  SpO2: 97%    BP Readings from Last 3 Encounters:  07/22/24 124/86  03/25/24 118/82  09/11/23 128/80   Wt Readings from Last 3 Encounters:  07/22/24 (!) 321 lb (145.6 kg)  03/25/24 (!) 316 lb 3.2 oz (143.4 kg)  09/11/23 (!) 316 lb 9.6 oz (143.6 kg)    Physical Exam Constitutional:      General: He is not in acute distress.    Appearance: Normal appearance.  HENT:     Head: Normocephalic.  Cardiovascular:     Rate and Rhythm: Normal rate and regular rhythm.     Heart sounds: Normal heart sounds.  Pulmonary:     Effort: Pulmonary effort is normal.     Breath sounds: Normal breath sounds.  Skin:    General: Skin is warm and dry.  Neurological:     General: No focal deficit present.     Mental Status: He is alert.  Psychiatric:        Mood and Affect: Mood normal.        Behavior: Behavior normal.      Assessment/Plan: Please see individual problem list.  Type 2 diabetes mellitus with hyperglycemia, without long-term current use of insulin (  HCC) Assessment & Plan: Glycemic control has improved, with HbA1c reduced from 8.3% to 7.3% today. The goal is an HbA1c of 7% or below. He reports no symptoms of hypoglycemia or hyperglycemia, but his diet requires improvement. Increase glipizide  to 10 mg daily. Continue Jardiance  and metformin  as prescribed. Encourage healthy diet and regular exercise.   Orders: -     POCT glycosylated hemoglobin (Hb A1C) -     glipiZIDE ; Take 1 tablet (10 mg total) by mouth daily before breakfast.  Dispense: 90 tablet; Refill: 3  Hypertension associated with diabetes (HCC) Assessment & Plan: Blood pressure is well controlled with losartan  and hydrochlorothiazide , with no symptoms reported. He does not self-monitor his  blood pressure. Continue losartan  and hydrochlorothiazide .  Orders: -     Empagliflozin ; Take 1 tablet (25 mg total) by mouth daily before breakfast.  Dispense: 90 tablet; Refill: 3 -     hydroCHLOROthiazide ; Take 0.5 tablets (12.5 mg total) by mouth daily. In the am  Dispense: 45 tablet; Refill: 3  Hyperlipidemia associated with type 2 diabetes mellitus (HCC) Assessment & Plan: Managed with pravastatin . Continue pravastatin  10 mg daily.    Hypokalemia -     Potassium Chloride  Crys ER; Take 1 tablet (20 mEq total) by mouth daily.  Dispense: 90 tablet; Refill: 3  Need for influenza vaccination -     Flu vaccine trivalent PF, 6mos and older(Flulaval,Afluria,Fluarix,Fluzone)     Return in about 3 months (around 10/22/2024) for Follow up.   Leron Glance, NP-C [facility] - [facility]

## 2024-07-22 NOTE — Assessment & Plan Note (Signed)
 Managed with pravastatin . Continue pravastatin  10 mg daily.

## 2024-07-22 NOTE — Assessment & Plan Note (Signed)
 Glycemic control has improved, with HbA1c reduced from 8.3% to 7.3% today. The goal is an HbA1c of 7% or below. He reports no symptoms of hypoglycemia or hyperglycemia, but his diet requires improvement. Increase glipizide  to 10 mg daily. Continue Jardiance  and metformin  as prescribed. Encourage healthy diet and regular exercise.
